# Patient Record
Sex: Female | Born: 1975 | Race: Black or African American | Hispanic: No | Marital: Single | Smoking: Never smoker
Health system: Southern US, Community
[De-identification: ages and names within clinical notes are randomized; demographics above are authoritative.]

## PROBLEM LIST (undated history)

## (undated) ENCOUNTER — Inpatient Hospital Stay (HOSPITAL_COMMUNITY): Payer: Self-pay

## (undated) DIAGNOSIS — R519 Headache, unspecified: Secondary | ICD-10-CM

## (undated) DIAGNOSIS — I10 Essential (primary) hypertension: Secondary | ICD-10-CM

## (undated) DIAGNOSIS — D649 Anemia, unspecified: Secondary | ICD-10-CM

## (undated) DIAGNOSIS — O26899 Other specified pregnancy related conditions, unspecified trimester: Secondary | ICD-10-CM

## (undated) DIAGNOSIS — R12 Heartburn: Secondary | ICD-10-CM

## (undated) DIAGNOSIS — Z5189 Encounter for other specified aftercare: Secondary | ICD-10-CM

## (undated) DIAGNOSIS — T7840XA Allergy, unspecified, initial encounter: Secondary | ICD-10-CM

## (undated) DIAGNOSIS — Z98891 History of uterine scar from previous surgery: Secondary | ICD-10-CM

## (undated) DIAGNOSIS — R51 Headache: Secondary | ICD-10-CM

## (undated) DIAGNOSIS — K219 Gastro-esophageal reflux disease without esophagitis: Secondary | ICD-10-CM

## (undated) HISTORY — PX: LAPAROSCOPY: SHX197

## (undated) HISTORY — DX: Essential (primary) hypertension: I10

## (undated) HISTORY — DX: Allergy, unspecified, initial encounter: T78.40XA

## (undated) HISTORY — DX: History of uterine scar from previous surgery: Z98.891

## (undated) HISTORY — DX: Gastro-esophageal reflux disease without esophagitis: K21.9

---

## 1998-01-16 ENCOUNTER — Emergency Department (HOSPITAL_COMMUNITY): Admission: EM | Admit: 1998-01-16 | Discharge: 1998-01-16 | Payer: Self-pay | Admitting: Emergency Medicine

## 1998-03-20 ENCOUNTER — Emergency Department (HOSPITAL_COMMUNITY): Admission: EM | Admit: 1998-03-20 | Discharge: 1998-03-20 | Payer: Self-pay

## 1998-10-09 ENCOUNTER — Emergency Department (HOSPITAL_COMMUNITY): Admission: EM | Admit: 1998-10-09 | Discharge: 1998-10-09 | Payer: Self-pay | Admitting: Emergency Medicine

## 1998-10-09 ENCOUNTER — Encounter: Payer: Self-pay | Admitting: Emergency Medicine

## 2000-03-09 ENCOUNTER — Other Ambulatory Visit: Admission: RE | Admit: 2000-03-09 | Discharge: 2000-03-09 | Payer: Self-pay | Admitting: Obstetrics and Gynecology

## 2000-03-31 ENCOUNTER — Encounter: Payer: Self-pay | Admitting: Obstetrics and Gynecology

## 2000-03-31 ENCOUNTER — Ambulatory Visit (HOSPITAL_COMMUNITY): Admission: RE | Admit: 2000-03-31 | Discharge: 2000-03-31 | Payer: Self-pay | Admitting: Obstetrics and Gynecology

## 2000-04-28 ENCOUNTER — Encounter: Payer: Self-pay | Admitting: Obstetrics and Gynecology

## 2000-04-28 ENCOUNTER — Ambulatory Visit (HOSPITAL_COMMUNITY): Admission: RE | Admit: 2000-04-28 | Discharge: 2000-04-28 | Payer: Self-pay | Admitting: Obstetrics and Gynecology

## 2000-05-17 DIAGNOSIS — D649 Anemia, unspecified: Secondary | ICD-10-CM

## 2000-05-17 HISTORY — DX: Anemia, unspecified: D64.9

## 2000-08-02 ENCOUNTER — Inpatient Hospital Stay (HOSPITAL_COMMUNITY): Admission: AD | Admit: 2000-08-02 | Discharge: 2000-08-02 | Payer: Self-pay | Admitting: Obstetrics and Gynecology

## 2000-08-15 ENCOUNTER — Inpatient Hospital Stay (HOSPITAL_COMMUNITY): Admission: AD | Admit: 2000-08-15 | Discharge: 2000-08-20 | Payer: Self-pay | Admitting: Obstetrics and Gynecology

## 2001-03-16 ENCOUNTER — Other Ambulatory Visit: Admission: RE | Admit: 2001-03-16 | Discharge: 2001-03-16 | Payer: Self-pay | Admitting: Obstetrics and Gynecology

## 2002-03-28 ENCOUNTER — Other Ambulatory Visit: Admission: RE | Admit: 2002-03-28 | Discharge: 2002-03-28 | Payer: Self-pay | Admitting: Obstetrics and Gynecology

## 2002-07-24 ENCOUNTER — Emergency Department (HOSPITAL_COMMUNITY): Admission: EM | Admit: 2002-07-24 | Discharge: 2002-07-24 | Payer: Self-pay | Admitting: Emergency Medicine

## 2004-01-10 ENCOUNTER — Other Ambulatory Visit: Admission: RE | Admit: 2004-01-10 | Discharge: 2004-01-10 | Payer: Self-pay | Admitting: Obstetrics and Gynecology

## 2005-01-11 ENCOUNTER — Other Ambulatory Visit: Admission: RE | Admit: 2005-01-11 | Discharge: 2005-01-11 | Payer: Self-pay | Admitting: Obstetrics and Gynecology

## 2005-06-24 ENCOUNTER — Emergency Department (HOSPITAL_COMMUNITY): Admission: EM | Admit: 2005-06-24 | Discharge: 2005-06-24 | Payer: Self-pay | Admitting: Emergency Medicine

## 2006-06-17 ENCOUNTER — Emergency Department (HOSPITAL_COMMUNITY): Admission: EM | Admit: 2006-06-17 | Discharge: 2006-06-17 | Payer: Self-pay | Admitting: Emergency Medicine

## 2010-08-16 LAB — HM PAP SMEAR

## 2011-11-04 ENCOUNTER — Encounter (HOSPITAL_COMMUNITY): Payer: Self-pay

## 2011-11-04 ENCOUNTER — Emergency Department (HOSPITAL_COMMUNITY)
Admission: EM | Admit: 2011-11-04 | Discharge: 2011-11-04 | Disposition: A | Discharge status: Home / Self Care | Payer: 59 | Source: Home / Self Care | Attending: Emergency Medicine | Admitting: Emergency Medicine

## 2011-11-04 DIAGNOSIS — J329 Chronic sinusitis, unspecified: Secondary | ICD-10-CM

## 2011-11-04 DIAGNOSIS — K2100 Gastro-esophageal reflux disease with esophagitis, without bleeding: Secondary | ICD-10-CM

## 2011-11-04 LAB — POCT RAPID STREP A: Streptococcus, Group A Screen (Direct): NEGATIVE

## 2011-11-04 MED ORDER — SUCRALFATE 1 GM/10ML PO SUSP: 1.0000 g | Freq: Four times a day (QID) | ORAL | Status: DC

## 2011-11-04 MED ORDER — FAMOTIDINE 20 MG PO TABS: 20.0000 mg | ORAL_TABLET | Freq: Two times a day (BID) | ORAL | Status: DC

## 2011-11-04 MED ORDER — FLUTICASONE PROPIONATE 50 MCG/ACT NA SUSP: 2.0000 | Freq: Every day | NASAL | Status: DC

## 2011-11-04 MED ORDER — DOXYCYCLINE HYCLATE 100 MG PO CAPS: 100.0000 mg | ORAL_CAPSULE | Freq: Two times a day (BID) | ORAL | Status: AC

## 2011-11-04 MED ORDER — PSEUDOEPHEDRINE-GUAIFENESIN ER 120-1200 MG PO TB12: 1.0000 | ORAL_TABLET | Freq: Two times a day (BID) | ORAL | Status: DC

## 2011-11-04 NOTE — ED Notes (Signed)
C/o cough and sore throat for 1 week.  States voice has been hoarse for 4 days and had 4 episodes of vomiting since last night.  Last vomited this am.

## 2011-11-04 NOTE — ED Provider Notes (Signed)
History     CSN: [id number]  Arrival date & time 11/04/11  1528   First MD Initiated Contact with Patient 11/04/11 1555      Chief Complaint  Patient presents with  . Sore Throat  . Cough    (Consider location/radiation/quality/duration/timing/severity/associated sxs/prior treatment) HPI Comments: Patient reports URI like symptoms with nasal congestion, rhinorrhea, postnasal drip, sore, irritated throat, and a nonproductive cough starting a week ago. She reports frontal sinus pressure/pain which is worse when she bends forward. No purulent nasal drainage. No fevers, dental pain, ear pain. She also reports hoarseness, a burning sensation in her chest, nausea, dyspepsia starting several days ago. Reports a subxiphoid burning, and States she vomited 4 times last night,  but thinks that this has been from the medications that she is taking. She is taking Robitussin and Tylenol. She states that the Robitussin has been helping with the sinus congestion. She is otherwise tolerating by mouth. No abdominal pain, decreased urine output, urinary complaints. No history of reflux.  ROS as noted in HPI. All other ROS negative.   Patient is a 36 y.o. female presenting with cough and pharyngitis. The history is provided by the patient. No language interpreter was used.  Cough  Sore Throat    History reviewed. No pertinent past medical history.  Past Surgical History  Procedure Date  . Cesarean section     History reviewed. No pertinent family history.  History  Substance Use Topics  . Smoking status: Never Smoker   . Smokeless tobacco: Not on file  . Alcohol Use: Yes    OB History    Grav Para Term Preterm Abortions TAB SAB Ect Mult Living                  Review of Systems  Respiratory: Positive for cough.     Allergies  Review of patient's allergies indicates no known allergies.  Home Medications   Current Outpatient Rx  Name Route Sig Dispense Refill  . ACETAMINOPHEN  500 MG PO TABS Oral Take 500 mg by mouth every 6 (six) hours as needed.    Marland Kitchen PSEUDOEPHEDRINE-DM-GG 30-10-100 MG/5ML PO LIQD Oral Take 10 mLs by mouth 4 (four) times daily as needed.    Marland Kitchen DOXYCYCLINE HYCLATE 100 MG PO CAPS Oral Take 1 capsule (100 mg total) by mouth 2 (two) times daily. X 7 days 14 capsule 0  . FAMOTIDINE 20 MG PO TABS Oral Take 1 tablet (20 mg total) by mouth 2 (two) times daily. 40 tablet 0  . FLUTICASONE PROPIONATE 50 MCG/ACT NA SUSP Nasal Place 2 sprays into the nose daily. 16 g 0  . PSEUDOEPHEDRINE-GUAIFENESIN ER [phone] MG PO TB12 Oral Take 1 tablet by mouth 2 (two) times daily. 20 each 0  . SUCRALFATE 1 GM/10ML PO SUSP Oral Take 10 mLs (1 g total) by mouth 4 (four) times daily. 10 mL before meals and before bedtime 240 mL 0    BP 125/84  Pulse 81  Temp 98.7 F (37.1 C) (Oral)  Resp 16  SpO2 98%  LMP 11/02/2011  Physical Exam  Nursing note and vitals reviewed. Constitutional: She is oriented to person, place, and time. She appears well-developed and well-nourished. No distress.  HENT:  Head: Normocephalic and atraumatic.  Right Ear: Tympanic membrane normal.  Left Ear: Tympanic membrane normal.  Nose: Mucosal edema and rhinorrhea present. Right sinus exhibits frontal sinus tenderness. Right sinus exhibits no maxillary sinus tenderness. Left sinus exhibits frontal sinus tenderness. Left sinus  exhibits no maxillary sinus tenderness.  Mouth/Throat: Uvula is midline and mucous membranes are normal. Posterior oropharyngeal erythema present. No oropharyngeal exudate or tonsillar abscesses.       Raspy voice. No drooling, stridor, muffled voice. Postnasal drip noted  Eyes: Conjunctivae and EOM are normal. Pupils are equal, round, and reactive to light.  Neck: Normal range of motion.  Cardiovascular: Normal rate, regular rhythm and normal heart sounds.   Pulmonary/Chest: Effort normal and breath sounds normal. She exhibits no tenderness.  Abdominal: Soft. Bowel sounds  are normal. She exhibits no distension. There is no tenderness. There is no rebound.  Musculoskeletal: Normal range of motion.  Neurological: She is alert and oriented to person, place, and time.  Skin: Skin is warm and dry.  Psychiatric: She has a normal mood and affect. Her behavior is normal. Judgment and thought content normal.    ED Course  Procedures (including critical care time)   Labs Reviewed  POCT RAPID STREP A ([facility] ONLY)   No results found.   1. Sinusitis   2. Reflux esophagitis     Results for orders placed during the hospital encounter of 11/04/11  POCT RAPID STREP A ([facility] ONLY)      Component Value Range   Streptococcus, Group A Screen (Direct) NEGATIVE  NEGATIVE    MDM  Patient has frontal sinus tenderness,  nasal congestion, H&P most consistent with a sinusitis. She also has some laryngitis, describing symptoms that are suggestive of reflux, especially as she has had increased coughing from all the postnasal drip. Lungs are clear. Reports sore throat, nausea, burning sensation in chest, which is worse with lying down, and eating. Will send home with saline nasal irrigation, Flonase, doxycycline for sinus infection. Also home with Carafate and some Pepcid for the reflux. Will refer her primary care practices for routine physical care.  Luiz Blare, MD 11/04/11 2045

## 2011-11-04 NOTE — Discharge Instructions (Signed)
[facility]: [address] [place] [address] [phone]  [facility]: [address] [place] [address]   [phone]  [facility]: [address] [place] [address] [phone]  [facility] : [address] [place] [address] [phone]  [facility]: [address] [place] [address] [phone]  [facility]: [address] [place] [address] [phone]  Dr. Oneal Grout [address] [facility] [place] [address] [phone]  Take the medication as written. Return if you get worse, have a fever >100.4, or for any concerns. You may take 600 mg of motrin with 1 gram of tylenol up to 4 times a day as needed for pain. This is an effective combination for pain.  Most sinus infections are viral and do not need antibiotics unless you have a high fever, have had this for 10 day, or you get better and then get sick again. Use a neti pot or the NeilMed sinus rinse as often as you want to to reduce nasal congestion. Follow the directions on the box.   Go to www.goodrx.com to look up your medications. This will give you a list of where you can find your prescriptions at the most affordable prices.

## 2011-11-12 ENCOUNTER — Telehealth (HOSPITAL_COMMUNITY): Payer: Self-pay | Admitting: *Deleted

## 2011-11-12 NOTE — ED Notes (Signed)
FMLA papers signed by Dr. Chaney Malling on 6/26. I faxed them and pt.'s chart after 1700 to [phone].  6/27  I did not get a confirmation, so I was not sure if they went through.  6 /28 I called Wyman Songster and left a message x 2. I have not heard from her. I re-faxed form and got a confirmation. I called pt.'s phone but it was not in service. I called her contact and left a message for her to call me. Vassie Moselle 11/12/2011

## 2011-11-12 NOTE — ED Notes (Signed)
Pt. called back and I told her she could pick up her original forms at the front desk in a brown envelope.  Pt. said she would come tomorrow. Vanessa Shannon 11/12/2011

## 2013-04-03 ENCOUNTER — Ambulatory Visit (INDEPENDENT_AMBULATORY_CARE_PROVIDER_SITE_OTHER): Payer: 59 | Admitting: Family Medicine

## 2013-04-03 ENCOUNTER — Encounter: Payer: Self-pay | Admitting: Family Medicine

## 2013-04-03 VITALS — BP 130/80 | Temp 98.8°F | Ht 64.25 in | Wt 185.0 lb

## 2013-04-03 DIAGNOSIS — Z7689 Persons encountering health services in other specified circumstances: Secondary | ICD-10-CM

## 2013-04-03 DIAGNOSIS — Z7189 Other specified counseling: Secondary | ICD-10-CM

## 2013-04-03 DIAGNOSIS — E669 Obesity, unspecified: Secondary | ICD-10-CM

## 2013-04-03 NOTE — Patient Instructions (Addendum)
-  We have ordered labs or studies at this visit. It can take up to 1-2 weeks for results and processing. We will contact you with instructions IF your results are abnormal. Normal results will be released to your MYCHART. If you have not heard from us or can not find your results in MYCHART in 2 weeks please contact our office.  -PLEASE SIGN UP FOR MYCHART TODAY   We recommend the following healthy lifestyle measures: - eat a healthy diet consisting of lots of vegetables, fruits, beans, nuts, seeds, healthy meats such as white chicken and fish and whole grains.  - avoid fried foods, fast food, processed foods, sodas, red meet and other fattening foods.  - get a least 150 minutes of aerobic exercise per week.   Follow up in: 1 year or as needed  

## 2013-04-03 NOTE — Progress Notes (Signed)
Chief Complaint  Patient presents with  . Establish Care    HPI:  Vanessa Shannon is here to establish care. Has gyn (Dr. Modesto Charon) - but has not has family doctor.  Last PCP and physical: last physical 2 years ago  Has the following chronic problems and concerns today:  There are no active problems to display for this patient.  Health Maintenance: UTD on flu, tdap  ROS: See pertinent positives and negatives per HPI.  Past Medical History  Diagnosis Date  . GERD (gastroesophageal reflux disease)     Family History  Problem Relation Age of Onset  . Lupus Mother   . Hypertension Mother   . Cancer Father     brain cancer  . Stroke Maternal Grandmother   . Hypertension Maternal Grandmother   . Stroke Paternal Grandfather     History   Social History  . Marital Status: Single    Spouse Name: N/A    Number of Children: N/A  . Years of Education: N/A   Social History Main Topics  . Smoking status: Never Smoker   . Smokeless tobacco: None  . Alcohol Use: Yes     Comment: occ/social   . Drug Use: No  . Sexual Activity: None   Other Topics Concern  . None   Social History Narrative   Work or School: Psychologist, sport and exercise at [employer]; going back to school for nursing      Home Situation: lives with daughter (32 yr old in 2014) and grandmother      Spiritual Beliefs: Christian      Lifestyle: no regular exercise; diet is horrible             Current outpatient prescriptions:Biotin 2.5 MG CAPS, Take by mouth., Disp: , Rfl:   EXAM:  Filed Vitals:   04/03/13 1612  BP: 130/80  Temp: 98.8 F (37.1 C)    Body mass index is 31.51 kg/(m^2).  GENERAL: vitals reviewed and listed above, alert, oriented, appears well hydrated and in no acute distress  HEENT: atraumatic, conjunttiva clear, no obvious abnormalities on inspection of external nose and ears  NECK: no obvious masses on inspection  LUNGS: clear to auscultation bilaterally, no wheezes, rales or rhonchi,  good air movement  CV: HRRR, no peripheral edema  MS: moves all extremities without noticeable abnormality  PSYCH: pleasant and cooperative, no obvious depression or anxiety  ASSESSMENT AND PLAN:  Discussed the following assessment and plan:  Encounter to establish care  Obesity, unspecified - Plan: Hemoglobin A1C, Lipid Panel  -We reviewed the PMH, PSH, FH, SH, Meds and Allergies. -We provided refills for any medications we will prescribe as needed. -We addressed current concerns per orders and patient instructions. -We have asked for records for pertinent exams, studies, vaccines and notes from previous providers. -We have advised patient to follow up per instructions below.   -Patient advised to return or notify a doctor immediately if symptoms worsen or persist or new concerns arise.  Patient Instructions  ..../.,,,,,/.,-We have ordered labs or studies at this visit. It can take up to 1-2 weeks for results and processing. We will contact you with instructions IF your results are abnormal. Normal results will be released to your [facility]. If you have not heard from [place] or can not find your results in [facility] in 2 weeks please contact our office.  -PLEASE SIGN UP FOR MYCHART TODAY   We recommend the following healthy lifestyle measures: - eat a healthy diet consisting of  lots of vegetables, fruits, beans, nuts, seeds, healthy meats such as white chicken and fish and whole grains.  - avoid fried foods, fast food, processed foods, sodas, red meet and other fattening foods.  - get a least 150 minutes of aerobic exercise per week.   Follow up in: 1 year or as needed      [name], [name]

## 2013-04-04 LAB — HEMOGLOBIN A1C: Hgb A1c MFr Bld: 4.5 % — ABNORMAL LOW (ref 4.6–6.5)

## 2013-04-04 LAB — LIPID PANEL
Total CHOL/HDL Ratio: 3
Triglycerides: 79 mg/dL (ref 0.0–149.0)

## 2013-04-04 NOTE — Progress Notes (Signed)
Quick Note:  Left a message for return call. ______ 

## 2013-04-13 NOTE — Progress Notes (Signed)
Quick Note:  Left a message for pt to return call; labs mailed to pt's home address. ______

## 2013-08-06 ENCOUNTER — Telehealth: Payer: Self-pay | Admitting: Family Medicine

## 2013-08-06 NOTE — Telephone Encounter (Signed)
Patient Information:  Caller Name: Sherrian  Phone: [phone]  Patient: Vanessa Shannon, Vanessa Shannon  Gender: Female  DOB: Sep 15, 1975  Age: 38 Years  PCP: Selena BattenKim (TEXT 1st, after 20 mins can call), Dahlia ClientH[facility])  Pregnant: No  Office Follow Up:  Does the office need to follow up with this patient?: No  Instructions For The Office: N/A  RN Note:  Patient calling c/o sinus congestion & headache.  Requesting appointment for 08/09/13.  Symptoms  Reason For Call & Symptoms: headache  Reviewed Health History In EMR: Yes  Reviewed Medications In EMR: Yes  Reviewed Allergies In EMR: Yes  Reviewed Surgeries / Procedures: Yes  Date of Onset of Symptoms: 07/16/2013 OB / GYN:  LMP: 07/23/2013  Guideline(s) Used:  Headache  Sinus Pain and Congestion  Disposition Per Guideline:   See Today or Tomorrow in Office  Reason For Disposition Reached:   Sinus congestion (pressure, fullness) present > 10 days  Advice Given:  Call Back If:   You become worse.  Patient Will Follow Care Advice:  YES

## 2013-08-08 NOTE — Telephone Encounter (Signed)
Noted appt with Orvan Falconerampbell.

## 2013-08-09 ENCOUNTER — Ambulatory Visit (INDEPENDENT_AMBULATORY_CARE_PROVIDER_SITE_OTHER): Payer: 59 | Admitting: Family

## 2013-08-09 ENCOUNTER — Encounter: Payer: Self-pay | Admitting: Family

## 2013-08-09 VITALS — BP 108/80 | HR 88 | Temp 98.2°F | Wt 182.0 lb

## 2013-08-09 DIAGNOSIS — J309 Allergic rhinitis, unspecified: Secondary | ICD-10-CM

## 2013-08-09 DIAGNOSIS — R51 Headache: Secondary | ICD-10-CM

## 2013-08-09 MED ORDER — PREDNISONE 20 MG PO TABS: ORAL_TABLET | ORAL | Status: AC

## 2013-08-09 NOTE — Progress Notes (Signed)
Subjective:    Patient ID: Vanessa Shannon, female    DOB: 1975/07/21, 38 y.o.   MRN: [id number]  HPI 38 year old African American female, nonsmoker who presents today with complaints of frontal sinus headache on the right side x4 weeks. Reports when she inhales a deep breath she has a sharp pain in her right frontal area. She's been taking over-the-counter Sudafed to help some. Reports cough and congestion but denies any sneezing. Has a history of seasonal allergies. Also has a family history of a brain tumor in her father deceased at age 39. Denies blurred vision or double vision.   Review of Systems  Constitutional: Negative.  Negative for fatigue.  HENT: Positive for congestion, sinus pressure and sore throat.   Eyes: Positive for itching.  Respiratory: Positive for cough. Negative for wheezing.   Cardiovascular: Negative.   Gastrointestinal: Negative.   Musculoskeletal: Negative.   Skin: Negative.   Allergic/Immunologic: Positive for environmental allergies.  Psychiatric/Behavioral: Negative.    Past Medical History  Diagnosis Date  . GERD (gastroesophageal reflux disease)     History   Social History  . Marital Status: Single    Spouse Name: N/A    Number of Children: N/A  . Years of Education: N/A   Occupational History  . Not on file.   Social History Main Topics  . Smoking status: Never Smoker   . Smokeless tobacco: Not on file  . Alcohol Use: Yes     Comment: occ/social   . Drug Use: No  . Sexual Activity: Not on file   Other Topics Concern  . 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 Cesarean section      Family History  Problem Relation Age  of Onset  . Lupus Mother   . Hypertension Mother   . Cancer Father     brain cancer  . Stroke Maternal Grandmother   . Hypertension Maternal Grandmother   . Stroke Paternal Grandfather     No Known Allergies  Current Outpatient Prescriptions on File Prior to Visit  Medication Sig Dispense Refill  . Biotin 2.5 MG CAPS Take by mouth.       No current facility-administered medications on file prior to visit.    BP 108/80  Pulse 88  Temp(Src) 98.2 F (36.8 C) (Oral)  Wt 182 lb (82.555 kg)  SpO2 99%chart    Objective:   Physical Exam  Constitutional: She is oriented to person, place, and time. She appears well-developed and well-nourished.  HENT:  Right Ear: External ear normal.  Left Ear: External ear normal.  Nose: Nose normal.  Mouth/Throat: Oropharynx is clear and moist.  Right frontal sinus tenderness to palpation.  Neck: Normal range of motion. Neck supple.  Cardiovascular: Normal rate, regular rhythm and normal heart sounds.   Pulmonary/Chest: Effort normal and breath sounds normal.  Musculoskeletal: Normal range of motion.  Neurological: She is alert and oriented to person, place, and time.  Skin: Skin is warm and dry.  Psychiatric: She has a normal mood and affect.  Assessment & Plan:  Oree was seen today for headache.  Diagnoses and associated orders for this visit:  Allergic rhinitis  Headache(784.0)  Other Orders - predniSONE (DELTASONE) 20 MG tablet; 60mg  PO qam x 3 days, 40mg  po qam x 3 days, 20mg  qam x 3 days   Call the office with any questions or concerns. Check as scheduled and as needed.

## 2013-08-09 NOTE — Patient Instructions (Signed)
If no better, call and we will order a CT of the head.    Hay Fever Hay fever is an allergic reaction to particles in the air. It cannot be passed from person to person. It cannot be cured, but it can be controlled. CAUSES  Hay fever is caused by something that triggers an allergic reaction (allergens). The following are examples of allergens:  Ragweed.  Feathers.  Animal dander.  Grass and tree pollens.  Cigarette smoke.  House dust.  Pollution. SYMPTOMS   Sneezing.  Runny or stuffy nose.  Tearing eyes.  Itchy eyes, nose, mouth, throat, skin, or other area.  Sore throat.  Headache.  Decreased sense of smell or taste. DIAGNOSIS Your caregiver will perform a physical exam and ask questions about the symptoms you are having.Allergy testing may be done to determine exactly what triggers your hay fever.  TREATMENT   Over-the-counter medicines may help symptoms. These include:  Antihistamines.  Decongestants. These may help with nasal congestion.  Your caregiver may prescribe medicines if over-the-counter medicines do not work.  Some people benefit from allergy shots when other medicines are not helpful. HOME CARE INSTRUCTIONS   Avoid the allergen that is causing your symptoms, if possible.  Take all medicine as told by your caregiver. SEEK MEDICAL CARE IF:   You have severe allergy symptoms and your current medicines are not helping.  Your treatment was working at one time, but you are now experiencing symptoms.  You have sinus congestion and pressure.  You develop a fever or headache.  You have thick nasal discharge.  You have asthma and have a worsening cough and wheezing. SEEK IMMEDIATE MEDICAL CARE IF:   You have swelling of your tongue or lips.  You have trouble breathing.  You feel lightheaded or like you are going to faint.  You have cold sweats.  You have a fever. Document Released: 05/03/2005 Document Revised: 07/26/2011 Document  Reviewed: 07/29/2010 [facility] Patient Information 2014 [place].

## 2013-10-01 ENCOUNTER — Telehealth: Payer: Self-pay | Admitting: Family Medicine

## 2013-10-01 NOTE — Telephone Encounter (Signed)
Pt was seen on 08/09/13 by padonda, states she had written her an rx predisone, and advised her that if it did not work she needed to have a mini ct scan, however pt states the medicine help and wants to get a new rx again, feels it is just sinuses. [facility] [address]

## 2013-10-01 NOTE — Telephone Encounter (Signed)
I called the pt and scheduled an appt with Dr Selena BattenKim for 10/03/13 at 8:45am.

## 2013-10-01 NOTE — Telephone Encounter (Signed)
Needs appt to examine.

## 2013-10-01 NOTE — Telephone Encounter (Signed)
Error/gd °

## 2013-10-03 ENCOUNTER — Encounter: Payer: Self-pay | Admitting: Family Medicine

## 2013-10-03 ENCOUNTER — Ambulatory Visit (INDEPENDENT_AMBULATORY_CARE_PROVIDER_SITE_OTHER): Payer: 59 | Admitting: Family Medicine

## 2013-10-03 VITALS — BP 110/82 | HR 78 | Temp 98.6°F | Ht 64.25 in | Wt 183.0 lb

## 2013-10-03 DIAGNOSIS — G43909 Migraine, unspecified, not intractable, without status migrainosus: Secondary | ICD-10-CM

## 2013-10-03 DIAGNOSIS — J309 Allergic rhinitis, unspecified: Secondary | ICD-10-CM

## 2013-10-03 DIAGNOSIS — J329 Chronic sinusitis, unspecified: Secondary | ICD-10-CM

## 2013-10-03 MED ORDER — AMOXICILLIN-POT CLAVULANATE 875-125 MG PO TABS: 1.0000 | ORAL_TABLET | Freq: Two times a day (BID) | ORAL | Status: DC

## 2013-10-03 MED ORDER — FLUTICASONE PROPIONATE 50 MCG/ACT NA SUSP: 2.0000 | Freq: Every day | NASAL | Status: DC

## 2013-10-03 MED ORDER — SUMATRIPTAN SUCCINATE 50 MG PO TABS: ORAL_TABLET | ORAL | Status: DC

## 2013-10-03 NOTE — Progress Notes (Signed)
Pre visit review using our clinic review tool, if applicable. No additional management support is needed unless otherwise documented below in the visit note. 

## 2013-10-03 NOTE — Progress Notes (Signed)
No chief complaint on file.   HPI:  Headaches: -seen by Vanessa Shannon 3/26 and treated with prednisone for ? Allergic sinusitis -reports these sharp stabbing headaches completely resolved -hx of migraine headaches from childhood and flare of this recently - light sensitivity at times -symptoms: sinus pain started about 1 week ago - R sided sometime, sometimes L sided, usually around eye, popping in nostrils, nasal congestion, sneezing, PND, cough, allergy issues, R maxillary and tooth pain, light sensitivity at times when has migraine -takes ibuprofen every day which helps but if stops the ibuprofen gets a headaches  -denies: fevers, chills, vision changes, weakness, numbness, weight loss -hx of sinus infection; felt like this in the past  ROS: See pertinent positives and negatives per HPI.  Past Medical History  Diagnosis Date  . GERD (gastroesophageal reflux disease)     Past Surgical History  Procedure Laterality Date  . Cesarean section      Family History  Problem Relation Age of Onset  . Lupus Mother   . Hypertension Mother   . Cancer Father     brain cancer  . Stroke Maternal Grandmother   . Hypertension Maternal Grandmother   . Stroke Paternal Grandfather     History   Social History  . Marital Status: Single    Spouse Name: N/A    Number of Children: N/A  . Years of Education: N/A   Social History Main Topics  . Smoking status: Never Smoker   . Smokeless tobacco: None  . Alcohol Use: Yes     Comment: occ/social   . Drug Use: No  . Sexual Activity: None   Other Topics Concern  . 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 , Disp: , Rfl: ;  pseudoephedrine (SUDAFED) 30 MG tablet, Take 30 mg by mouth as needed for congestion., Disp: , Rfl: ;  amoxicillin-clavulanate (AUGMENTIN) 875-125 MG per tablet, Take 1 tablet by mouth 2 (two) times daily., Disp: 20 tablet, Rfl: 0 fluticasone (FLONASE) 50 MCG/ACT nasal spray, Place 2 sprays into both nostrils daily., Disp: 16 g, Rfl: 2;  SUMAtriptan (IMITREX) 50 MG tablet, Take 1 at the onset of migraine then can take one more in 2 hours if needed. No more then 2 in 24 hours., Disp: 10 tablet, Rfl: 0  EXAM:  Filed Vitals:   10/03/13 0900  BP: 110/82  Pulse: 78  Temp: 98.6 F (37 C)    Body mass index is 31.17 kg/(m^2).  GENERAL: vitals reviewed and listed above, alert, oriented, appears well hydrated and in no acute distress  HEENT: atraumatic, conjunttiva clear, PERRLA, EOMI, visual acuity grossly intact, no obvious abnormalities on inspection of external nose and ears, normal appearance of ear canals and TMs, clear nasal congestion, mild post oropharyngeal erythema with PND, no tonsillar edema or exudate, no sinus TTP  NECK: no obvious masses on inspection  LUNGS: clear to auscultation bilaterally, no wheezes, rales or rhonchi, good air movement  CV: HRRR, no  peripheral edema  MS: moves all extremities without noticeable abnormality  PSYCH: pleasant and cooperative, no obvious depression or anxiety  NEURO: CN II-XII grossly intact, finger to nose normal  ASSESSMENT AND PLAN:  Discussed the following assessment and plan:  Allergic rhinitis - Plan: fluticasone (FLONASE) 50 MCG/ACT nasal spray  Sinusitis - Plan: amoxicillin-clavulanate (AUGMENTIN) 875-125 MG per tablet  Migraine - Plan: SUMAtriptan (IMITREX) 50 MG tablet  -we discussed possible serious and likely etiologies, workup and treatment, treatment risks and return precautions - likely AR, secondary sinuitis,  migraine -after this discussion, Vanessa Shannon opted for trial of triptan, monitor triggers, tx of allergies and sinusitis -follow up advised in one month -of course, we advised Vanessa Shannon  to return or notify a doctor immediately if symptoms worsen or persist or new concerns arise.  -Patient advised to return or notify a doctor immediately if symptoms worsen or persist or new concerns arise.  Patient Instructions  -use the over the counter pain medication no more then 2-3 times per week  -complete the antibiotic -As we discussed, we have prescribed a new medication (AUGMENTIN) for you at this appointment. We discussed the common and serious potential adverse effects of this medication and you can review these and more with the pharmacist when you pick up your medication.  Please follow the instructions for use carefully and notify us immediately if you have any problems taking this medication.  -try the migraine medication  -take zyrtec before your go to sleep daily  -take the flonase 2 sprays each nostril daily  -keep headache journal  Follow up in 1 month     Vanessa Vanessa Shannon

## 2013-10-03 NOTE — Patient Instructions (Addendum)
-  use the over the counter pain medication no more then 2-3 times per week  -complete the antibiotic -As we discussed, we have prescribed a new medication (AUGMENTIN) for you at this appointment. We discussed the common and serious potential adverse effects of this medication and you can review these and more with the pharmacist when you pick up your medication.  Please follow the instructions for use carefully and notify us immediately if you have any problems taking this medication.  -try the migraine medication  -take zyrtec before your go to sleep daily  -take the flonase 2 sprays each nostril daily  -keep headache journal  Follow up in 1 month

## 2014-08-28 LAB — OB RESULTS CONSOLE RPR: RPR: NONREACTIVE

## 2014-08-28 LAB — OB RESULTS CONSOLE HIV ANTIBODY (ROUTINE TESTING): HIV: NONREACTIVE

## 2014-08-28 LAB — OB RESULTS CONSOLE GC/CHLAMYDIA
Chlamydia: NEGATIVE
Gonorrhea: NEGATIVE

## 2014-08-28 LAB — OB RESULTS CONSOLE ABO/RH: RH TYPE: POSITIVE

## 2014-08-28 LAB — OB RESULTS CONSOLE GBS: STREP GROUP B AG: NEGATIVE

## 2014-08-28 LAB — OB RESULTS CONSOLE RUBELLA ANTIBODY, IGM: RUBELLA: IMMUNE

## 2014-08-28 LAB — OB RESULTS CONSOLE ANTIBODY SCREEN: ANTIBODY SCREEN: NEGATIVE

## 2014-11-14 ENCOUNTER — Encounter (HOSPITAL_COMMUNITY): Payer: Self-pay

## 2014-11-15 ENCOUNTER — Encounter (HOSPITAL_COMMUNITY)
Admission: RE | Admit: 2014-11-15 | Discharge: 2014-11-15 | Disposition: A | Discharge status: Home / Self Care | Payer: 59 | Source: Ambulatory Visit | Attending: Obstetrics and Gynecology | Admitting: Obstetrics and Gynecology

## 2014-11-15 ENCOUNTER — Encounter (HOSPITAL_COMMUNITY): Payer: Self-pay

## 2014-11-15 DIAGNOSIS — Z01818 Encounter for other preprocedural examination: Secondary | ICD-10-CM | POA: Insufficient documentation

## 2014-11-15 HISTORY — DX: Headache, unspecified: R51.9

## 2014-11-15 HISTORY — DX: Other specified pregnancy related conditions, unspecified trimester: O26.899

## 2014-11-15 HISTORY — DX: Anemia, unspecified: D64.9

## 2014-11-15 HISTORY — DX: Headache: R51

## 2014-11-15 HISTORY — DX: Other specified pregnancy related conditions, unspecified trimester: R12

## 2014-11-15 LAB — CBC
HEMATOCRIT: 35.5 % — AB (ref 36.0–46.0)
HEMOGLOBIN: 11.9 g/dL — AB (ref 12.0–15.0)
MCH: 32.5 pg (ref 26.0–34.0)
MCHC: 33.5 g/dL (ref 30.0–36.0)
MCV: 97 fL (ref 78.0–100.0)
PLATELETS: 163 10*3/uL (ref 150–400)
RBC: 3.66 MIL/uL — ABNORMAL LOW (ref 3.87–5.11)
RDW: 13.2 % (ref 11.5–15.5)
WBC: 7.5 10*3/uL (ref 4.0–10.5)

## 2014-11-15 LAB — ABO/RH: ABO/RH(D): O POS

## 2014-11-15 LAB — TYPE AND SCREEN
ABO/RH(D): O POS
Antibody Screen: NEGATIVE

## 2014-11-15 NOTE — Patient Instructions (Signed)
Your procedure is scheduled on:7/5.16  Enter through the Main Entrance at :11am Pick up desk phone and dial [phone] and inform us of your arrival.  Please call [phone] if you have any problems the morning of surgery.  Remember: Do not eat food or drink liquids, including water, after midnight:Monday Clear liquids are ok until:8am on Tuesday   You may brush your teeth the morning of surgery.   DO NOT wear jewelry, eye make-up, lipstick,body lotion, or dark fingernail polish.  (Polished toes are ok) You may wear deodorant.  If you are to be admitted after surgery, leave suitcase in car until your room has been assigned. Patients discharged on the day of surgery will not be allowed to drive home. Wear loose fitting, comfortable clothes for your ride home.

## 2014-11-16 LAB — RPR: RPR Ser Ql: NONREACTIVE

## 2014-11-17 ENCOUNTER — Encounter (HOSPITAL_COMMUNITY): Payer: Self-pay | Admitting: Anesthesiology

## 2014-11-17 NOTE — Anesthesia Preprocedure Evaluation (Addendum)
Anesthesia Evaluation  Patient identified by MRN, date of birth, ID band Patient awake    Reviewed: Allergy & Precautions, NPO status , Patient's Chart, lab work & pertinent test results  Airway Mallampati: III  TM Distance: >3 FB Neck ROM: Full    Dental no notable dental hx. (+) Teeth Intact   Pulmonary neg pulmonary ROS,  breath sounds clear to auscultation  Pulmonary exam normal       Cardiovascular negative cardio ROS Normal cardiovascular examRhythm:Regular Rate:Normal     Neuro/Psych  Headaches, negative psych ROS   GI/Hepatic Neg liver ROS, GERD-  Medicated and Controlled,  Endo/Other  Obesity  Renal/GU negative Renal ROS  negative genitourinary   Musculoskeletal negative musculoskeletal ROS (+)   Abdominal (+) + obese,   Peds  Hematology  (+) anemia ,   Anesthesia Other Findings   Reproductive/Obstetrics (+) Pregnancy Previous C/Section Post op hemorrhage                            Anesthesia Physical Anesthesia Plan  ASA: II  Anesthesia Plan: Spinal   Post-op Pain Management:    Induction:   Airway Management Planned: Natural Airway  Additional Equipment:   Intra-op Plan:   Post-operative Plan:   Informed Consent: I have reviewed the patients History and Physical, chart, labs and discussed the procedure including the risks, benefits and alternatives for the proposed anesthesia with the patient or authorized representative who has indicated his/her understanding and acceptance.     Plan Discussed with: Anesthesiologist, CRNA and Surgeon  Anesthesia Plan Comments:         Anesthesia Quick Evaluation

## 2014-11-18 MED ORDER — CEFAZOLIN SODIUM-DEXTROSE 2-3 GM-% IV SOLR: 2.0000 g | INTRAVENOUS | Status: DC

## 2014-11-19 ENCOUNTER — Encounter (HOSPITAL_COMMUNITY): Payer: Self-pay

## 2014-11-19 ENCOUNTER — Inpatient Hospital Stay (HOSPITAL_COMMUNITY)
Admission: RE | Admit: 2014-11-19 | Discharge: 2014-11-21 | DRG: 766 | Disposition: A | Discharge status: Home / Self Care | Payer: 59 | Source: Ambulatory Visit | Attending: Obstetrics and Gynecology | Admitting: Obstetrics and Gynecology

## 2014-11-19 ENCOUNTER — Inpatient Hospital Stay (HOSPITAL_COMMUNITY): Payer: 59 | Admitting: Anesthesiology

## 2014-11-19 ENCOUNTER — Encounter (HOSPITAL_COMMUNITY)
Admission: RE | Disposition: A | Discharge status: Home / Self Care | Payer: Self-pay | Source: Ambulatory Visit | Attending: Obstetrics and Gynecology

## 2014-11-19 DIAGNOSIS — O09523 Supervision of elderly multigravida, third trimester: Secondary | ICD-10-CM | POA: Diagnosis not present

## 2014-11-19 DIAGNOSIS — O3421 Maternal care for scar from previous cesarean delivery: Principal | ICD-10-CM | POA: Diagnosis present

## 2014-11-19 DIAGNOSIS — K219 Gastro-esophageal reflux disease without esophagitis: Secondary | ICD-10-CM | POA: Diagnosis present

## 2014-11-19 DIAGNOSIS — O9962 Diseases of the digestive system complicating childbirth: Secondary | ICD-10-CM | POA: Diagnosis present

## 2014-11-19 DIAGNOSIS — Z3483 Encounter for supervision of other normal pregnancy, third trimester: Secondary | ICD-10-CM | POA: Diagnosis present

## 2014-11-19 DIAGNOSIS — Z3A39 39 weeks gestation of pregnancy: Secondary | ICD-10-CM | POA: Diagnosis present

## 2014-11-19 DIAGNOSIS — Z98891 History of uterine scar from previous surgery: Secondary | ICD-10-CM

## 2014-11-19 LAB — PREPARE RBC (CROSSMATCH)

## 2014-11-19 SURGERY — Surgical Case
Anesthesia: Spinal | Site: Abdomen

## 2014-11-19 MED ORDER — LACTATED RINGERS IV SOLN
INTRAVENOUS | Status: DC | PRN
Start: 2014-11-19 — End: 2014-11-19
  Administered 2014-11-19: 13:00:00 via INTRAVENOUS

## 2014-11-19 MED ORDER — NALOXONE HCL 1 MG/ML IJ SOLN: 1.0000 ug/kg/h | INTRAVENOUS | Status: DC | PRN

## 2014-11-19 MED ORDER — MEPERIDINE HCL 25 MG/ML IJ SOLN: 6.2500 mg | INTRAMUSCULAR | Status: DC | PRN

## 2014-11-19 MED ORDER — FENTANYL CITRATE (PF) 100 MCG/2ML IJ SOLN
25.0000 ug | INTRAMUSCULAR | Status: DC | PRN
  Administered 2014-11-19: 50 ug via INTRAVENOUS

## 2014-11-19 MED ORDER — KETOROLAC TROMETHAMINE 30 MG/ML IJ SOLN
30.0000 mg | Freq: Four times a day (QID) | INTRAMUSCULAR | Status: AC | PRN
  Administered 2014-11-19: 30 mg via INTRAMUSCULAR

## 2014-11-19 MED ORDER — PHENYLEPHRINE 8 MG IN D5W 100 ML (0.08MG/ML) PREMIX OPTIME
INJECTION | INTRAVENOUS | Status: DC | PRN
  Administered 2014-11-19: 60 ug/min via INTRAVENOUS

## 2014-11-19 MED ORDER — PRENATAL MULTIVITAMIN CH
1.0000 | ORAL_TABLET | Freq: Every day | ORAL | Status: DC
  Administered 2014-11-20 – 2014-11-21 (×2): 1 via ORAL
  Filled 2014-11-19 (×2): qty 1

## 2014-11-19 MED ORDER — MENTHOL 3 MG MT LOZG: 1.0000 | LOZENGE | OROMUCOSAL | Status: DC | PRN

## 2014-11-19 MED ORDER — DIPHENHYDRAMINE HCL 50 MG/ML IJ SOLN: 12.5000 mg | INTRAMUSCULAR | Status: DC | PRN

## 2014-11-19 MED ORDER — SODIUM CHLORIDE 0.9 % IJ SOLN: 3.0000 mL | INTRAMUSCULAR | Status: DC | PRN

## 2014-11-19 MED ORDER — BUPIVACAINE IN DEXTROSE 0.75-8.25 % IT SOLN
INTRATHECAL | Status: DC | PRN
  Administered 2014-11-19: 1.5 mL via INTRATHECAL

## 2014-11-19 MED ORDER — SENNOSIDES-DOCUSATE SODIUM 8.6-50 MG PO TABS
2.0000 | ORAL_TABLET | ORAL | Status: DC
  Administered 2014-11-20 (×2): 2 via ORAL
  Filled 2014-11-19 (×2): qty 2

## 2014-11-19 MED ORDER — NALBUPHINE HCL 10 MG/ML IJ SOLN: 5.0000 mg | INTRAMUSCULAR | Status: DC | PRN

## 2014-11-19 MED ORDER — MORPHINE SULFATE (PF) 0.5 MG/ML IJ SOLN
INTRAMUSCULAR | Status: DC | PRN
  Administered 2014-11-19: .2 mg via INTRATHECAL

## 2014-11-19 MED ORDER — FENTANYL CITRATE (PF) 100 MCG/2ML IJ SOLN
INTRAMUSCULAR | Status: DC | PRN
  Administered 2014-11-19: 10 ug via INTRATHECAL

## 2014-11-19 MED ORDER — ZOLPIDEM TARTRATE 5 MG PO TABS: 5.0000 mg | ORAL_TABLET | Freq: Every evening | ORAL | Status: DC | PRN

## 2014-11-19 MED ORDER — OXYCODONE-ACETAMINOPHEN 5-325 MG PO TABS
1.0000 | ORAL_TABLET | ORAL | Status: DC | PRN
  Administered 2014-11-20: 1 via ORAL
  Administered 2014-11-20: 2 via ORAL
  Administered 2014-11-20 – 2014-11-21 (×3): 1 via ORAL
  Administered 2014-11-21: 2 via ORAL
  Filled 2014-11-19 (×5): qty 1
  Filled 2014-11-19: qty 2
  Filled 2014-11-19: qty 1

## 2014-11-19 MED ORDER — WITCH HAZEL-GLYCERIN EX PADS: 1.0000 "application " | MEDICATED_PAD | CUTANEOUS | Status: DC | PRN

## 2014-11-19 MED ORDER — ONDANSETRON HCL 4 MG/2ML IJ SOLN
4.0000 mg | Freq: Three times a day (TID) | INTRAMUSCULAR | Status: DC | PRN
  Administered 2014-11-19: 4 mg via INTRAVENOUS
  Filled 2014-11-19: qty 2

## 2014-11-19 MED ORDER — MEASLES, MUMPS & RUBELLA VAC ~~LOC~~ INJ
0.5000 mL | INJECTION | Freq: Once | SUBCUTANEOUS | Status: DC
  Filled 2014-11-19: qty 0.5

## 2014-11-19 MED ORDER — ONDANSETRON HCL 4 MG/2ML IJ SOLN
INTRAMUSCULAR | Status: DC | PRN
  Administered 2014-11-19: 4 mg via INTRAVENOUS

## 2014-11-19 MED ORDER — OXYTOCIN 10 UNIT/ML IJ SOLN
40.0000 [IU] | INTRAVENOUS | Status: DC | PRN
  Administered 2014-11-19: 40 [IU] via INTRAVENOUS

## 2014-11-19 MED ORDER — OXYTOCIN 40 UNITS IN LACTATED RINGERS INFUSION - SIMPLE MED: 62.5000 mL/h | INTRAVENOUS | Status: AC

## 2014-11-19 MED ORDER — MAGNESIUM HYDROXIDE 400 MG/5ML PO SUSP: 30.0000 mL | ORAL | Status: DC | PRN

## 2014-11-19 MED ORDER — IBUPROFEN 600 MG PO TABS: 600.0000 mg | ORAL_TABLET | Freq: Four times a day (QID) | ORAL | Status: DC | PRN

## 2014-11-19 MED ORDER — FENTANYL CITRATE (PF) 100 MCG/2ML IJ SOLN
INTRAMUSCULAR | Status: AC
  Filled 2014-11-19: qty 2

## 2014-11-19 MED ORDER — IBUPROFEN 600 MG PO TABS
600.0000 mg | ORAL_TABLET | Freq: Four times a day (QID) | ORAL | Status: DC
  Administered 2014-11-20 – 2014-11-21 (×7): 600 mg via ORAL
  Filled 2014-11-19 (×7): qty 1

## 2014-11-19 MED ORDER — OXYTOCIN 10 UNIT/ML IJ SOLN
INTRAMUSCULAR | Status: AC
  Filled 2014-11-19: qty 4

## 2014-11-19 MED ORDER — LACTATED RINGERS IV SOLN
INTRAVENOUS | Status: DC
  Administered 2014-11-19 (×3): via INTRAVENOUS

## 2014-11-19 MED ORDER — KETOROLAC TROMETHAMINE 30 MG/ML IJ SOLN
INTRAMUSCULAR | Status: AC
  Filled 2014-11-19: qty 1

## 2014-11-19 MED ORDER — NALBUPHINE HCL 10 MG/ML IJ SOLN: 5.0000 mg | Freq: Once | INTRAMUSCULAR | Status: AC | PRN

## 2014-11-19 MED ORDER — CEFAZOLIN SODIUM-DEXTROSE 2-3 GM-% IV SOLR
INTRAVENOUS | Status: AC
  Administered 2014-11-19: 2 g via INTRAVENOUS
  Filled 2014-11-19: qty 50

## 2014-11-19 MED ORDER — SCOPOLAMINE 1 MG/3DAYS TD PT72
1.0000 | MEDICATED_PATCH | Freq: Once | TRANSDERMAL | Status: DC
  Administered 2014-11-19: 1.5 mg via TRANSDERMAL

## 2014-11-19 MED ORDER — ONDANSETRON HCL 4 MG/2ML IJ SOLN
INTRAMUSCULAR | Status: AC
  Filled 2014-11-19: qty 4

## 2014-11-19 MED ORDER — SCOPOLAMINE 1 MG/3DAYS TD PT72
MEDICATED_PATCH | TRANSDERMAL | Status: AC
  Administered 2014-11-19: 1.5 mg via TRANSDERMAL
  Filled 2014-11-19: qty 1

## 2014-11-19 MED ORDER — MORPHINE SULFATE 0.5 MG/ML IJ SOLN
INTRAMUSCULAR | Status: AC
  Filled 2014-11-19: qty 10

## 2014-11-19 MED ORDER — DIPHENHYDRAMINE HCL 25 MG PO CAPS: 25.0000 mg | ORAL_CAPSULE | Freq: Four times a day (QID) | ORAL | Status: DC | PRN

## 2014-11-19 MED ORDER — LANOLIN HYDROUS EX OINT: 1.0000 "application " | TOPICAL_OINTMENT | CUTANEOUS | Status: DC | PRN

## 2014-11-19 MED ORDER — TETANUS-DIPHTH-ACELL PERTUSSIS 5-2.5-18.5 LF-MCG/0.5 IM SUSP: 0.5000 mL | Freq: Once | INTRAMUSCULAR | Status: DC

## 2014-11-19 MED ORDER — DIPHENHYDRAMINE HCL 25 MG PO CAPS: 25.0000 mg | ORAL_CAPSULE | ORAL | Status: DC | PRN

## 2014-11-19 MED ORDER — PHENYLEPHRINE 8 MG IN D5W 100 ML (0.08MG/ML) PREMIX OPTIME
INJECTION | INTRAVENOUS | Status: AC
  Filled 2014-11-19: qty 100

## 2014-11-19 MED ORDER — NALOXONE HCL 0.4 MG/ML IJ SOLN: 0.4000 mg | INTRAMUSCULAR | Status: DC | PRN

## 2014-11-19 MED ORDER — DIBUCAINE 1 % RE OINT
1.0000 | TOPICAL_OINTMENT | RECTAL | Status: DC | PRN
Start: 2014-11-19 — End: 2014-11-21

## 2014-11-19 MED ORDER — KETOROLAC TROMETHAMINE 30 MG/ML IJ SOLN: 30.0000 mg | Freq: Four times a day (QID) | INTRAMUSCULAR | Status: AC | PRN

## 2014-11-19 MED ORDER — SCOPOLAMINE 1 MG/3DAYS TD PT72
1.0000 | MEDICATED_PATCH | Freq: Once | TRANSDERMAL | Status: DC
  Filled 2014-11-19: qty 1

## 2014-11-19 MED ORDER — SIMETHICONE 80 MG PO CHEW
80.0000 mg | CHEWABLE_TABLET | ORAL | Status: DC | PRN
  Administered 2014-11-21: 80 mg via ORAL
  Filled 2014-11-19: qty 1

## 2014-11-19 MED ORDER — ACETAMINOPHEN 325 MG PO TABS: 650.0000 mg | ORAL_TABLET | ORAL | Status: DC | PRN

## 2014-11-19 MED ORDER — LACTATED RINGERS IV SOLN
INTRAVENOUS | Status: DC
  Administered 2014-11-20: 01:00:00 via INTRAVENOUS

## 2014-11-19 MED ORDER — SUMATRIPTAN SUCCINATE 50 MG PO TABS
50.0000 mg | ORAL_TABLET | ORAL | Status: DC | PRN
  Filled 2014-11-19: qty 1

## 2014-11-19 SURGICAL SUPPLY — 31 items
BENZOIN TINCTURE PRP APPL 2/3 (GAUZE/BANDAGES/DRESSINGS) ×2 IMPLANT
CLAMP CORD UMBIL (MISCELLANEOUS) IMPLANT
CLOTH BEACON ORANGE TIMEOUT ST (SAFETY) ×2 IMPLANT
CONTAINER PREFILL 10% NBF 15ML (MISCELLANEOUS) IMPLANT
DRAPE SHEET LG 3/4 BI-LAMINATE (DRAPES) IMPLANT
DRSG OPSITE POSTOP 4X10 (GAUZE/BANDAGES/DRESSINGS) ×2 IMPLANT
DURAPREP 26ML APPLICATOR (WOUND CARE) ×2 IMPLANT
ELECT REM PT RETURN 9FT ADLT (ELECTROSURGICAL) ×2
ELECTRODE REM PT RTRN 9FT ADLT (ELECTROSURGICAL) ×1 IMPLANT
EXTRACTOR VACUUM KIWI (MISCELLANEOUS) IMPLANT
EXTRACTOR VACUUM M CUP 4 TUBE (SUCTIONS) IMPLANT
GLOVE ORTHO TXT STRL SZ7.5 (GLOVE) ×2 IMPLANT
GOWN STRL REUS W/TWL LRG LVL3 (GOWN DISPOSABLE) ×4 IMPLANT
KIT ABG SYR 3ML LUER SLIP (SYRINGE) IMPLANT
NEEDLE HYPO 25X5/8 SAFETYGLIDE (NEEDLE) ×2 IMPLANT
NS IRRIG 1000ML POUR BTL (IV SOLUTION) ×2 IMPLANT
PACK C SECTION WH (CUSTOM PROCEDURE TRAY) ×2 IMPLANT
PAD OB MATERNITY 4.3X12.25 (PERSONAL CARE ITEMS) ×2 IMPLANT
RTRCTR C-SECT PINK 25CM LRG (MISCELLANEOUS) ×2 IMPLANT
STRIP CLOSURE SKIN 1/2X4 (GAUZE/BANDAGES/DRESSINGS) ×2 IMPLANT
SUT CHROMIC 1 CTX 36 (SUTURE) ×4 IMPLANT
SUT PLAIN 0 NONE (SUTURE) IMPLANT
SUT PLAIN 2 0 XLH (SUTURE) IMPLANT
SUT VIC AB 0 CT1 27 (SUTURE) ×2
SUT VIC AB 0 CT1 27XBRD ANBCTR (SUTURE) ×2 IMPLANT
SUT VIC AB 2-0 CT1 (SUTURE) ×2 IMPLANT
SUT VIC AB 2-0 CT1 27 (SUTURE) ×1
SUT VIC AB 2-0 CT1 TAPERPNT 27 (SUTURE) ×1 IMPLANT
SUT VIC AB 4-0 KS 27 (SUTURE) IMPLANT
TOWEL OR 17X24 6PK STRL BLUE (TOWEL DISPOSABLE) ×2 IMPLANT
TRAY FOLEY CATH SILVER 14FR (SET/KITS/TRAYS/PACK) ×2 IMPLANT

## 2014-11-19 NOTE — Lactation Note (Signed)
This note was copied from the chart of Girl Emanuel Zenaida Niecemos. Lactation Consultation Note  Patient Name: Girl Natasha MeadKhidja Eddinger ZOXWR'UToday's Date: 11/19/2014 Reason for consult: Initial assessment Baby 5 hours old. Family holding baby and baby sleeping. Mom states baby has nursed once right after delivery. Enc mom to offer lots of STS and nurse with cues. Enc mom to call for assistance with latching as needed. Mom states that she did not nurse her first child, but she does remember her milk coming in a little later than normal--she thinks due to the fact that she had a hemorrhage with first child. [facility], aware of OP/BFSG and LC phone line assistance after D/C.  Maternal Data Has patient been taught Hand Expression?: Yes (Per mom.) Does the patient have breastfeeding experience prior to this delivery?: No  Feeding Feeding Type: Breast Fed Length of feed: 10 min  LATCH Score/Interventions                      Lactation Tools Discussed/Used     Consult Status Consult Status: Follow-up Date: 11/20/14 Follow-up type: In-patient    Geralynn OchsWILLIARD, Jerlyn Pain 11/19/2014, 5:44 PM

## 2014-11-19 NOTE — Transfer of Care (Signed)
Immediate Anesthesia Transfer of Care Note  Patient: Vanessa Shannon  Procedure(s) Performed: Procedure(s): CESAREAN SECTION (N/A)  Patient Location: PACU  Anesthesia Type:Spinal  Level of Consciousness: awake, alert  and oriented  Airway & Oxygen Therapy: Patient Spontanous Breathing  Post-op Assessment: Report given to RN and Post -op Vital signs reviewed and stable  Post vital signs: Reviewed and stable  Last Vitals:  Filed Vitals:   11/19/14 1056  BP: 133/88  Pulse: 99  Temp: 37 C  Resp: 18    Complications: No apparent anesthesia complications

## 2014-11-19 NOTE — Interval H&P Note (Signed)
History and Physical Interval Note:  11/19/2014 12:00 PM  Vanessa Shannon  has presented today for surgery, with the diagnosis of Repeat C/Section  The various methods of treatment have been discussed with the patient and family. After consideration of risks, benefits and other options for treatment, the patient has consented to  Procedure(s): CESAREAN SECTION (N/A) as a surgical intervention .  The patient's history has been reviewed, patient examined, no change in status, stable for surgery.  I have reviewed the patient's chart and labs.  Questions were answered to the patient's satisfaction.     Mischell Branford D

## 2014-11-19 NOTE — H&P (Signed)
Vanessa Shannon is a 39 y.o. female, G4 P1021, EGA [redacted] weeks with EDC 7-12 presenting for repeat c-section.  Previous LTCS with cervical laceration, prenatal care uncomplicated.  Maternal Medical History:  Fetal activity: Perceived fetal activity is normal.    Prenatal complications: no prenatal complications   OB History    Gravida Para Term Preterm AB TAB SAB Ectopic Multiple Living   4 1   1  1   1      Past Medical History  Diagnosis Date  . GERD (gastroesophageal reflux disease)   . Heartburn during pregnancy   . Headache   . Anemia 2002    postop hemorrhage   Past Surgical History  Procedure Laterality Date  . Cesarean section     Family History: family history includes Cancer in her father; Hypertension in her maternal grandmother and mother; Lupus in her mother; Stroke in her maternal grandmother and paternal grandfather. Social History:  reports that she has never smoked. She does not have any smokeless tobacco history on file. She reports that she drinks alcohol. She reports that she does not use illicit drugs.   Prenatal Transfer Tool  Maternal Diabetes: No Genetic Screening: Normal Maternal Ultrasounds/Referrals: Normal Fetal Ultrasounds or other Referrals:  None Maternal Substance Abuse:  No Significant Maternal Medications:  None Significant Maternal Lab Results:  Lab values include: Group B Strep negative Other Comments:  None  Review of Systems  Respiratory: Negative.   Cardiovascular: Negative.       There were no vitals taken for this visit. Maternal Exam:  Abdomen: Patient reports no abdominal tenderness. Surgical scars: low transverse.   Estimated fetal weight is 7 1/2 lbs.   Fetal presentation: vertex  Introitus: Normal vulva. Normal vagina.    Physical Exam  Constitutional: She appears well-developed and well-nourished.  Neck: Neck supple. No thyromegaly present.  Cardiovascular: Normal rate, regular rhythm and normal heart sounds.   No  murmur heard. Respiratory: Effort normal and breath sounds normal. No respiratory distress. She has no wheezes.  GI: Soft.    Prenatal labs: ABO, Rh: --/--/O POS, O POS (07/01 0900) Antibody: NEG (07/01 0900) Rubella: Immune (04/13 0000) RPR: Non Reactive (07/01 0900)  HBsAg:    HIV: Non-reactive (04/13 0000)  GBS: Negative (04/13 0000)   Assessment/Plan: IUP at 39 weeks with previous c-section with cervical laceration, admitted for repeat c-section.  Procedure and risks have been discussed.   Shandelle Borrelli D 11/19/2014, 7:51 AM

## 2014-11-19 NOTE — Anesthesia Procedure Notes (Signed)
Spinal Patient location during procedure: OR Start time: 11/19/2014 12:20 PM Staffing Anesthesiologist: Mal AmabileFOSTER, Geza Beranek Performed by: anesthesiologist  Preanesthetic Checklist Completed: patient identified, site marked, surgical consent, pre-op evaluation, timeout performed, IV checked, risks and benefits discussed and monitors and equipment checked Spinal Block Patient position: sitting Prep: site prepped and draped and DuraPrep Patient monitoring: heart rate, cardiac monitor, continuous pulse ox and blood pressure Approach: midline Location: L3-4 Injection technique: single-shot Needle Needle type: Sprotte  Needle gauge: 24 G Needle length: 9 cm Needle insertion depth: 6 cm Assessment Sensory level: T4 Additional Notes Patient tolerated procedure well. Adequate sensory level.

## 2014-11-19 NOTE — Op Note (Signed)
Preoperative diagnosis: Intrauterine pregnancy at 39 weeks, previous c-section Postoperative diagnosis: Same Procedure: Repeat low transverse cesarean section without extensions Surgeon: Lavina Hammanodd Ayiden Milliman M.D. Assistant:  Karmen Stabsracie Sumner, RNFA Anesthesia: Spinal  Findings: Patient had normal gravid anatomy and delivered a viable female infant with Apgars of 9 and 9 weight pending Estimated blood loss: 800 cc Specimens: Placenta sent to labor and delivery Complications: None  Procedure in detail: The patient was taken to the operating room and placed in the sitting position. Dr. Malen GauzeFoster instilled spinal anesthesia.  She was then placed in the dorsosupine position with left tilt. Abdomen was then prepped and draped in the usual sterile fashion, and a foley catheter was inserted. The level of her anesthesia was found to be adequate. Abdomen was entered via a standard Pfannenstiel incision through her previous scar. Once the peritoneal cavity was entered the Alexis disposable self-retaining retractor was placed and good visualization was achieved. A 4 cm transverse incision was then made in the lower uterine segment pushing the bladder inferior. Once the uterine cavity was entered the incision was extended digitally. The fetal vertex was grasped and delivered through the incision atraumatically. Mouth and nares were suctioned. The remainder of the infant then delivered atraumatically. Cord was doubly clamped and cut and the infant handed to the awaiting pediatric team. Cord blood was obtained. The placenta delivered spontaneously. Uterus was wiped dry with clean lap pad and all clots and debris were removed. Uterine incision was inspected and found to be free of extensions. Uterine incision was closed in 1 layer with running #1 Chromic. Bleeding from the right angle was controlled with 2 figure 8 sutures of ) Chromic.  Tubes and ovaries were inspected and found to be normal. Uterine incision was inspected and  found to be hemostatic. Bleeding from serosal edges was controlled with electrocautery. The Alexis retractor was removed. Subfascial space was irrigated and made hemostatic with electrocautery. Peritoneum was closed with 2-0 Vicryl.  Fascia was closed in running fashion starting at both ends and meeting in the middle with 0 Vicryl. Subcutaneous tissue was then irrigated and made hemostatic with electrocautery, then closed with running 2-0 plain gut. Skin was closed with running 4-0 Vicryl subcuticular suture followed by steri-strips and a sterile dressing. Patient tolerated the procedure well and was taken to the recovery in stable condition. Counts were correct x2, she received Ancef 2 g IV at the beginning of the procedure and she had PAS hose on throughout the procedure.

## 2014-11-20 ENCOUNTER — Encounter (HOSPITAL_COMMUNITY): Payer: Self-pay | Admitting: Obstetrics and Gynecology

## 2014-11-20 LAB — CBC
HCT: 30.5 % — ABNORMAL LOW (ref 36.0–46.0)
Hemoglobin: 10.1 g/dL — ABNORMAL LOW (ref 12.0–15.0)
MCH: 32.4 pg (ref 26.0–34.0)
MCHC: 33.1 g/dL (ref 30.0–36.0)
MCV: 97.8 fL (ref 78.0–100.0)
Platelets: 153 10*3/uL (ref 150–400)
RBC: 3.12 MIL/uL — AB (ref 3.87–5.11)
RDW: 13.5 % (ref 11.5–15.5)
WBC: 10.9 10*3/uL — AB (ref 4.0–10.5)

## 2014-11-20 LAB — BIRTH TISSUE RECOVERY COLLECTION (PLACENTA DONATION)

## 2014-11-20 NOTE — Progress Notes (Signed)
Subjective: Postpartum Day #1: Cesarean Delivery Patient reports incisional pain, tolerating PO and no problems voiding.    Objective: Vital signs in last 24 hours: Temp:  [97.5 F (36.4 C)-99 F (37.2 C)] 98.7 F (37.1 C) (07/06 1207) Pulse Rate:  [67-90] 83 (07/06 1207) Resp:  [17-25] 20 (07/06 1207) BP: (95-134)/(59-81) 123/60 mmHg (07/06 1207) SpO2:  [94 %-100 %] 99 % (07/06 1207) Weight:  [92.08 kg (203 lb)] 92.08 kg (203 lb) (07/05 1600)  Physical Exam:  General: alert Lochia: appropriate Uterine Fundus: firm Incision: dressing C/D/I   Recent Labs  11/20/14 0531  HGB 10.1*  HCT 30.5*    Assessment/Plan: Status post Cesarean section. Doing well postoperatively.  Continue current care, ambulate.  Vanessa Shannon D 11/20/2014, 1:21 PM

## 2014-11-20 NOTE — Anesthesia Postprocedure Evaluation (Signed)
  Anesthesia Post-op Note  Patient: Vanessa Shannon  Procedure(s) Performed: Procedure(s): CESAREAN SECTION (N/A)  Patient Location: PACU  Anesthesia Type:Spinal  Level of Consciousness: awake, alert  and oriented  Airway and Oxygen Therapy: Patient Spontanous Breathing  Post-op Pain: none  Post-op Assessment: Post-op Vital signs reviewed, Patient's Cardiovascular Status Stable, Respiratory Function Stable, Patent Airway, No signs of Nausea or vomiting, Pain level controlled, No headache, No backache, Spinal receding and Patient able to bend at knees LLE Motor Response: Purposeful movement LLE Sensation: Tingling RLE Motor Response: Purposeful movement RLE Sensation: Increased      Post-op Vital Signs: Reviewed and stable   Complications: No apparent anesthesia complications

## 2014-11-20 NOTE — Lactation Note (Signed)
This note was copied from the chart of Girl Christina Zenaida Niecemos. Lactation Consultation Note Follow up visit at 31 hour of age.  & feedings in the past 24 hours with 2 voids and 5 stools.  Visitor holding baby.  Mom reports baby is wanting more, she isn't sure she has enough.  Encouraged mom cluster feedings is normal and helps with milk supply.  Baby should breastfeed at least 8 times in 24 hours or more.  Baby showing feeding cues.  Mom is able to demonstrate hand expression with drops noted.  Assisted with modified cradle hold, but baby buried face into mom and body was twisted.  Offered to assist with football hold and baby latched well with wide flanged lips strong rhythmic sucking for several minutes.  Mom to call for assist as needed.   Patient Name: Girl Natasha MeadKhidja Lopiccolo ZOXWR'UToday's Date: 11/20/2014 Reason for consult: Follow-up assessment   Maternal Data Has patient been taught Hand Expression?: Yes  Feeding Feeding Type: Breast Fed Length of feed:  (several minutes)  LATCH Score/Interventions Latch: Grasps breast easily, tongue down, lips flanged, rhythmical sucking.  Audible Swallowing: A few with stimulation Intervention(s): Hand expression  Type of Nipple: Everted at rest and after stimulation  Comfort (Breast/Nipple): Soft / non-tender     Hold (Positioning): Assistance needed to correctly position infant at breast and maintain latch. Intervention(s): Breastfeeding basics reviewed;Support Pillows;Position options  LATCH Score: 8  Lactation Tools Discussed/Used     Consult Status Consult Status: Follow-up Date: 11/21/14 Follow-up type: In-patient    Laira Penninger, Arvella MerlesJana Lynn 11/20/2014, 8:32 PM

## 2014-11-21 MED ORDER — OXYCODONE-ACETAMINOPHEN 5-325 MG PO TABS: 1.0000 | ORAL_TABLET | ORAL | Status: DC | PRN

## 2014-11-21 MED ORDER — IBUPROFEN 600 MG PO TABS: 600.0000 mg | ORAL_TABLET | Freq: Four times a day (QID) | ORAL | Status: DC | PRN

## 2014-11-21 NOTE — Discharge Instructions (Signed)
As per discharge pamphlet °

## 2014-11-21 NOTE — Discharge Summary (Signed)
Obstetric Discharge Summary Reason for Admission: cesarean section Prenatal Procedures: none Intrapartum Procedures: cesarean: low cervical, transverse Postpartum Procedures: none Complications-Operative and Postpartum: none HEMOGLOBIN  Date Value Ref Range Status  11/20/2014 10.1* 12.0 - 15.0 g/dL Final   HCT  Date Value Ref Range Status  11/20/2014 30.5* 36.0 - 46.0 % Final    Physical Exam:  General: alert Lochia: appropriate Uterine Fundus: firm Incision: healing well   Discharge Diagnoses: Term Pregnancy-delivered  Discharge Information: Date: 11/21/2014 Activity: pelvic rest and no strenuous activity Diet: routine Medications: Ibuprofen and Percocet Condition: stable Instructions: refer to practice specific booklet Discharge to: home Follow-up Information    Follow up with Michale Emmerich D, MD. Schedule an appointment as soon as possible for a visit in 2 weeks.   Specialty:  Obstetrics and Gynecology   Contact information:   [address], [address] [place] [id number] [phone]       Newborn Data: Live born female  Birth Weight: 6 lb 8.2 oz (2954 g) APGAR: 8, 9  Home with mother.  Vanessa Shannon 11/21/2014, 8:27 AM

## 2014-11-21 NOTE — Lactation Note (Addendum)
This note was copied from the chart of Vanessa Shannon. Lactation Consultation Note  Patient Name: Vanessa Shannon ZOXWR'UToday's Date: 11/21/2014 Reason for consult: Follow-up assessment Baby 44 hours old. Mom reports nursing going well and she is seeing milk around baby's mouth after nursing. Mom states her breasts are starting to fill as well. Enc mom to continue to nurse with cues. Parents state that they discussed with pediatrician the baby's 8% weight loss and length of time since last stool. Mom [facility]. Mom aware of OP/BFSG and LC phone line assistance after D/C.  Maternal Data    Feeding Feeding Type: Breast Fed Length of feed: 5 min  LATCH Score/Interventions                      Lactation Tools Discussed/Used     Consult Status Consult Status: PRN    Geralynn OchsWILLIARD, Deniyah Dillavou 11/21/2014, 9:16 AM

## 2014-11-21 NOTE — Progress Notes (Signed)
POD #2 Doing well, wants to go home Afeb, VSS Abd- soft, fundus firm, incision intact D/c home 

## 2014-11-23 LAB — TYPE AND SCREEN
ABO/RH(D): O POS
ANTIBODY SCREEN: NEGATIVE
UNIT DIVISION: 0
UNIT DIVISION: 0

## 2015-02-27 ENCOUNTER — Encounter: Payer: Self-pay | Admitting: Family Medicine

## 2015-02-27 ENCOUNTER — Ambulatory Visit (INDEPENDENT_AMBULATORY_CARE_PROVIDER_SITE_OTHER): Payer: 59 | Admitting: Family Medicine

## 2015-02-27 VITALS — BP 142/98 | HR 94 | Temp 98.2°F | Ht 64.0 in | Wt 187.6 lb

## 2015-02-27 DIAGNOSIS — I10 Essential (primary) hypertension: Secondary | ICD-10-CM

## 2015-02-27 DIAGNOSIS — G43009 Migraine without aura, not intractable, without status migrainosus: Secondary | ICD-10-CM

## 2015-02-27 DIAGNOSIS — Z23 Encounter for immunization: Secondary | ICD-10-CM

## 2015-02-27 MED ORDER — PROPRANOLOL HCL 40 MG PO TABS: 40.0000 mg | ORAL_TABLET | Freq: Two times a day (BID) | ORAL | Status: DC

## 2015-02-27 NOTE — Progress Notes (Signed)
HPI:  Vanessa Shannon is a pleasant 39 yo F whom had a baby recently several months ago, and is here for follow up on:  HTN: -started a bout 1 week prior to delivery, breastfeeding, on micronor -reports intermittently elevated since -feels fine -did gain weight with pregnancy and has not lost this -denies: CP, SOB, DOE -hx of migraines and does still struggle with these and her OB said it is ok to take imitrex  ROS: See pertinent positives and negatives per HPI.  Past Medical History  Diagnosis Date  . GERD (gastroesophageal reflux disease)   . Heartburn during pregnancy   . Headache   . Anemia 2002    postop hemorrhage    Past Surgical History  Procedure Laterality Date  . Cesarean section    . Cesarean section N/A 11/19/2014    Procedure: CESAREAN SECTION;  Surgeon: Lavina Hammanodd Meisinger, MD;  Location: [facility];  Service: Obstetrics;  Laterality: N/A;    Family History  Problem Relation Age of Onset  . Lupus Mother   . Hypertension Mother   . Cancer Father     brain cancer  . Stroke Maternal Grandmother   . Hypertension Maternal Grandmother   . Stroke Paternal Grandfather     Social History   Social History  . Marital Status: Single    Spouse Name: N/A  . Number of Children: N/A  . Years of Education: N/A   Social History Main Topics  . Smoking status: Never Smoker   . Smokeless tobacco: None  . Alcohol Use: Yes     Comment: occ/social   . Drug Use: No  . Sexual Activity: Yes   Other Topics Concern  . 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  acetaminophen (TYLENOL) 500 MG tablet, Take 500 mg by mouth every 6 (six) hours as needed., Disp: , Rfl:  .  diphenhydrAMINE  (BENADRYL) 25 MG tablet, Take 25 mg by mouth every 6 (six) hours as needed., Disp: , Rfl:  .  ibuprofen (ADVIL,MOTRIN) 600 MG tablet, Take 1 tablet (600 mg total) by mouth every 6 (six) hours as needed for mild pain., Disp: 30 tablet, Rfl: 0 .  Prenatal Vit-Fe Fumarate-FA (MULTIVITAMIN-PRENATAL) 27-0.8 MG TABS tablet, Take 1 tablet by mouth daily at 12 noon., Disp: , Rfl:  .  SUMAtriptan (IMITREX) 50 MG tablet, Take 1 at the onset of migraine then can take one more in 2 hours if needed. No more then 2 in 24 hours., Disp: 10 tablet, Rfl: 0 .  propranolol (INDERAL) 40 MG tablet, Take 1 tablet (40 mg total) by mouth 2 (two) times daily., Disp: 60 tablet, Rfl: 3  EXAM:  Filed Vitals:   02/27/15 0855  BP: 142/98  Pulse: 94  Temp: 98.2 F (36.8 C)    Body mass index is 32.19 kg/(m^2).  GENERAL: vitals reviewed and listed above, alert, oriented, appears well hydrated and in no acute distress  HEENT: atraumatic, conjunttiva clear, no obvious abnormalities on inspection of external nose and ears  NECK: no obvious masses on inspection  LUNGS: clear to auscultation bilaterally, no  wheezes, rales or rhonchi, good air movement  CV: HRRR, no peripheral edema  MS: moves all extremities without noticeable abnormality  PSYCH: pleasant and cooperative, no obvious depression or anxiety  ASSESSMENT AND PLAN:  Discussed the following assessment and plan:  Essential hypertension  Migraine without aura and without status migrainosus, not intractable  Lactating mother  -discussed risks of various medications and safety data on different agents -she opted to try propranolol for HTN and migraine prophylaxis, starting at a low dose then titrating up -follow up in 1 month or sooner if needed -Patient advised to return or notify a doctor immediately if symptoms worsen or persist or new concerns arise.  Patient Instructions  BEFORE YOU LEAVE: -flu shot -schedule follow up in 1 month  Start the  propranolol  -As we discussed, we have prescribed a new medication for you at this appointment. We discussed the common and serious potential adverse effects of this medication and you can review these and more with the pharmacist when you pick up your medication.  Please follow the instructions for use carefully and notify us immediately if you have any problems taking this medication.  We recommend the following healthy lifestyle measures: - eat a healthy whole foods diet consisting of regular small meals composed of vegetables, fruits, beans, nuts, seeds, healthy meats such as white chicken and fish and whole grains.  - avoid sweets, white starchy foods, fried foods, fast food, processed foods, sodas, red meet and other fattening foods.  - get a least 150-300 minutes of aerobic exercise per week.       Kriste Basque R.

## 2015-02-27 NOTE — Patient Instructions (Signed)
BEFORE YOU LEAVE: -flu shot -schedule follow up in 1 month  Start the propranolol  -As we discussed, we have prescribed a new medication for you at this appointment. We discussed the common and serious potential adverse effects of this medication and you can review these and more with the pharmacist when you pick up your medication.  Please follow the instructions for use carefully and notify us immediately if you have any problems taking this medication.  We recommend the following healthy lifestyle measures: - eat a healthy whole foods diet consisting of regular small meals composed of vegetables, fruits, beans, nuts, seeds, healthy meats such as white chicken and fish and whole grains.  - avoid sweets, white starchy foods, fried foods, fast food, processed foods, sodas, red meet and other fattening foods.  - get a least 150-300 minutes of aerobic exercise per week.

## 2015-02-27 NOTE — Progress Notes (Signed)
Pre visit review using our clinic review tool, if applicable. No additional management support is needed unless otherwise documented below in the visit note. 

## 2016-03-23 ENCOUNTER — Inpatient Hospital Stay (HOSPITAL_COMMUNITY)
Admission: AD | Admit: 2016-03-23 | Discharge: 2016-03-23 | Disposition: A | Discharge status: Home / Self Care | Payer: 59 | Source: Ambulatory Visit | Attending: Family Medicine | Admitting: Family Medicine

## 2016-03-23 ENCOUNTER — Encounter (HOSPITAL_COMMUNITY): Payer: Self-pay | Admitting: *Deleted

## 2016-03-23 DIAGNOSIS — R1031 Right lower quadrant pain: Secondary | ICD-10-CM | POA: Diagnosis not present

## 2016-03-23 DIAGNOSIS — Z3A34 34 weeks gestation of pregnancy: Secondary | ICD-10-CM | POA: Insufficient documentation

## 2016-03-23 DIAGNOSIS — O26893 Other specified pregnancy related conditions, third trimester: Secondary | ICD-10-CM | POA: Diagnosis not present

## 2016-03-23 DIAGNOSIS — Z8759 Personal history of other complications of pregnancy, childbirth and the puerperium: Secondary | ICD-10-CM

## 2016-03-23 DIAGNOSIS — Z98891 History of uterine scar from previous surgery: Secondary | ICD-10-CM

## 2016-03-23 DIAGNOSIS — O0933 Supervision of pregnancy with insufficient antenatal care, third trimester: Secondary | ICD-10-CM | POA: Diagnosis not present

## 2016-03-23 DIAGNOSIS — O34219 Maternal care for unspecified type scar from previous cesarean delivery: Secondary | ICD-10-CM | POA: Insufficient documentation

## 2016-03-23 DIAGNOSIS — R1011 Right upper quadrant pain: Secondary | ICD-10-CM | POA: Diagnosis not present

## 2016-03-23 DIAGNOSIS — E860 Dehydration: Secondary | ICD-10-CM

## 2016-03-23 DIAGNOSIS — O09523 Supervision of elderly multigravida, third trimester: Secondary | ICD-10-CM | POA: Diagnosis not present

## 2016-03-23 DIAGNOSIS — R109 Unspecified abdominal pain: Secondary | ICD-10-CM | POA: Diagnosis not present

## 2016-03-23 DIAGNOSIS — O26899 Other specified pregnancy related conditions, unspecified trimester: Secondary | ICD-10-CM

## 2016-03-23 HISTORY — DX: History of uterine scar from previous surgery: Z98.891

## 2016-03-23 LAB — DIFFERENTIAL
BASOS ABS: 0 10*3/uL (ref 0.0–0.1)
Basophils Relative: 0 %
Eosinophils Absolute: 0.1 10*3/uL (ref 0.0–0.7)
Eosinophils Relative: 1 %
LYMPHS ABS: 2.5 10*3/uL (ref 0.7–4.0)
LYMPHS PCT: 26 %
Monocytes Absolute: 0.7 10*3/uL (ref 0.1–1.0)
Monocytes Relative: 7 %
NEUTROS ABS: 6.4 10*3/uL (ref 1.7–7.7)
Neutrophils Relative %: 66 %

## 2016-03-23 LAB — URINALYSIS, ROUTINE W REFLEX MICROSCOPIC
Bilirubin Urine: NEGATIVE
GLUCOSE, UA: NEGATIVE mg/dL
Hgb urine dipstick: NEGATIVE
Ketones, ur: >80 mg/dL — AB
Leukocytes, UA: NEGATIVE
Nitrite: NEGATIVE
Protein, ur: NEGATIVE mg/dL
SPECIFIC GRAVITY, URINE: 1.015 (ref 1.005–1.030)
pH: 6 (ref 5.0–8.0)

## 2016-03-23 LAB — COMPREHENSIVE METABOLIC PANEL
ALBUMIN: 3.3 g/dL — AB (ref 3.5–5.0)
ALT: 11 U/L — ABNORMAL LOW (ref 14–54)
AST: 15 U/L (ref 15–41)
Alkaline Phosphatase: 71 U/L (ref 38–126)
Anion gap: 11 (ref 5–15)
BILIRUBIN TOTAL: 0.4 mg/dL (ref 0.3–1.2)
BUN: 6 mg/dL (ref 6–20)
CALCIUM: 9 mg/dL (ref 8.9–10.3)
CO2: 21 mmol/L — ABNORMAL LOW (ref 22–32)
Chloride: 103 mmol/L (ref 101–111)
Creatinine, Ser: 0.47 mg/dL (ref 0.44–1.00)
GFR calc Af Amer: >60 mL/min (ref >60)
GFR calc non Af Amer: >60 mL/min (ref >60)
GLUCOSE: 80 mg/dL (ref 65–99)
Potassium: 3.6 mmol/L (ref 3.5–5.1)
SODIUM: 135 mmol/L (ref 135–145)
Total Protein: 7.2 g/dL (ref 6.5–8.1)

## 2016-03-23 LAB — CBC
HCT: 34.3 % — ABNORMAL LOW (ref 36.0–46.0)
HEMOGLOBIN: 11.5 g/dL — AB (ref 12.0–15.0)
MCH: 31.5 pg (ref 26.0–34.0)
MCHC: 33.5 g/dL (ref 30.0–36.0)
MCV: 94 fL (ref 78.0–100.0)
PLATELETS: 219 10*3/uL (ref 150–400)
RBC: 3.65 MIL/uL — ABNORMAL LOW (ref 3.87–5.11)
RDW: 13.2 % (ref 11.5–15.5)
WBC: 9.6 10*3/uL (ref 4.0–10.5)

## 2016-03-23 LAB — HEPATITIS B SURFACE ANTIGEN: Hepatitis B Surface Ag: NEGATIVE

## 2016-03-23 LAB — WET PREP, GENITAL
Clue Cells Wet Prep HPF POC: NONE SEEN
SPERM: NONE SEEN
Trich, Wet Prep: NONE SEEN
Yeast Wet Prep HPF POC: NONE SEEN

## 2016-03-23 MED ORDER — PRENATAL PLUS 27-1 MG PO TABS: 1.0000 | ORAL_TABLET | Freq: Every day | ORAL | 0 refills | Status: DC

## 2016-03-23 NOTE — Discharge Instructions (Signed)
Round Ligament Pain °The round ligament is a cord of muscle and tissue that helps to support the uterus. It can become a source of pain during pregnancy if it becomes stretched or twisted as the baby grows. The pain usually begins in the second trimester of pregnancy, and it can come and go until the baby is delivered. It is not a serious problem, and it does not cause harm to the baby. °Round ligament pain is usually a short, sharp, and pinching pain, but it can also be a dull, lingering, and aching pain. The pain is felt in the lower side of the abdomen or in the groin. It usually starts deep in the groin and moves up to the outside of the hip area. Pain can occur with: °· A sudden change in position. °· Rolling over in bed. °· Coughing or sneezing. °· Physical activity. °HOME CARE INSTRUCTIONS °Watch your condition for any changes. Take these steps to help with your pain: °· When the pain starts, relax. Then try: °· Sitting down. °· Flexing your knees up to your abdomen. °· Lying on your side with one pillow under your abdomen and another pillow between your legs. °· Sitting in a warm bath for 15-20 minutes or until the pain goes away. °· Take over-the-counter and prescription medicines only as told by your health care provider. °· Move slowly when you sit and stand. °· Avoid long walks if they cause pain. °· Stop or lessen your physical activities if they cause pain. °SEEK MEDICAL CARE IF: °· Your pain does not go away with treatment. °· You feel pain in your back that you did not have before. °· Your medicine is not helping. °SEEK IMMEDIATE MEDICAL CARE IF: °· You develop a fever or chills. °· You develop uterine contractions. °· You develop vaginal bleeding. °· You develop nausea or vomiting. °· You develop diarrhea. °· You have pain when you urinate. °  °This information is not intended to replace advice given to you by your health care provider. Make sure you discuss any questions you have with your health  care provider. °  °Document Released: 02/10/2008 Document Revised: 07/26/2011 Document Reviewed: 07/10/2014 °Elsevier Interactive Patient Education ©2016 Elsevier Inc. ° °Abdominal Pain During Pregnancy °Abdominal pain is common in pregnancy. Most of the time, it does not cause harm. There are many causes of abdominal pain. Some causes are more serious than others. Some of the causes of abdominal pain in pregnancy are easily diagnosed. Occasionally, the diagnosis takes time to understand. Other times, the cause is not determined. Abdominal pain can be a sign that something is very wrong with the pregnancy, or the pain may have nothing to do with the pregnancy at all. For this reason, always tell your health care provider if you have any abdominal discomfort. °HOME CARE INSTRUCTIONS  °Monitor your abdominal pain for any changes. The following actions may help to alleviate any discomfort you are experiencing: °· Do not have sexual intercourse or put anything in your vagina until your symptoms go away completely. °· Get plenty of rest until your pain improves. °· Drink clear fluids if you feel nauseous. Avoid solid food as long as you are uncomfortable or nauseous. °· Only take over-the-counter or prescription medicine as directed by your health care provider. °· Keep all follow-up appointments with your health care provider. °SEEK IMMEDIATE MEDICAL CARE IF: °· You are bleeding, leaking fluid, or passing tissue from the vagina. °· You have increasing pain or cramping. °·   You have persistent vomiting. °· You have painful or bloody urination. °· You have a fever. °· You notice a decrease in your baby's movements. °· You have extreme weakness or feel faint. °· You have shortness of breath, with or without abdominal pain. °· You develop a severe headache with abdominal pain. °· You have abnormal vaginal discharge with abdominal pain. °· You have persistent diarrhea. °· You have abdominal pain that continues even after  rest, or gets worse. °MAKE SURE YOU:  °· Understand these instructions. °· Will watch your condition. °· Will get help right away if you are not doing well or get worse. °  °This information is not intended to replace advice given to you by your health care provider. Make sure you discuss any questions you have with your health care provider. °  °Document Released: 05/03/2005 Document Revised: 02/21/2013 Document Reviewed: 11/30/2012 °Elsevier Interactive Patient Education ©2016 Elsevier Inc. ° °

## 2016-03-23 NOTE — MAU Note (Signed)
Pt C/O sharp pain in RLQ & around umbilicus since yesterday, thinks they are braxton hicks contractions.  Denies bleeding or LOF.  No PNC.

## 2016-03-23 NOTE — MAU Provider Note (Signed)
History     CSN: [id number]  Arrival date and time: 03/23/16 1659      Chief Complaint  Patient presents with  . Abdominal Pain   Vanessa Shannon is a 40 y.o. [id number] at 5030w3d with NPC presenting with periumbilical and right groin pain for about 2 weeks. She thinks the pain is due to Braxton-Hicks contractions and is different from RLP she has had.     Abdominal Pain  This is a new problem. The current episode started 1 to 4 weeks ago. The onset quality is undetermined. The problem occurs intermittently. The problem has been gradually worsening. The pain is located in the RLQ and epigastric region. The pain is at a severity of 5/10. The pain is mild. The quality of the pain is sharp. The abdominal pain does not radiate. Pertinent negatives include no anorexia, constipation, diarrhea, dysuria, fever, frequency, hematuria, myalgias, nausea or vomiting. Nothing aggravates the pain. The pain is relieved by nothing. She has tried nothing for the symptoms. Her past medical history is significant for abdominal surgery. C/S x2   Pregnancy Course: NPC. 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 Has 5916 month old. LMP sure. Only known pregnancy complication was an instance of vaginal spotting about a month ago.    OB History  Gravida Para Term Preterm AB Living  5 2 2   2 2   SAB TAB Ectopic Multiple Live Births  1     0 2    # Outcome Date GA Lbr Len/2nd Weight Sex Delivery Anes PTL Lv  5 Current           4 Term 11/19/14 7213w0d  2.954 kg (6 lb 8.2 oz) F CS-LTranv Spinal  LIV  3 Term 08/16/00     CS-LTranv     2 AB           1 SAB               Past Medical History:  Diagnosis Date  . Anemia 2002   postop hemorrhage  . GERD (gastroesophageal reflux disease)   . Headache   . Heartburn during pregnancy   PMH addendum: States HTN diagnosed with 2016 pregnancy at term and was on Inderal from PMD for about 2 months after her 6wk PP check; then was D/C'd and BPs in normal range since then.   Past  Surgical History:  Procedure Laterality Date  . CESAREAN SECTION    . CESAREAN SECTION N/A 11/19/2014   Procedure: CESAREAN SECTION;  Surgeon: Lavina Hammanodd Meisinger, MD;  Location: [facility];  Service: Obstetrics;  Laterality: N/A;    Family History  Problem Relation Age of Onset  . Lupus Mother   . Hypertension Mother   . Cancer Father     brain cancer  . Stroke Maternal Grandmother   . Hypertension Maternal Grandmother   . Stroke Paternal Grandfather     Social History  Substance Use Topics  . Smoking status: Never Smoker  . Smokeless tobacco: Never Used  . Alcohol use Yes     Comment: occ/social     Allergies: No Known Allergies  Prescriptions Prior to Admission  Medication Sig Dispense Refill Last Dose  . acetaminophen (TYLENOL) 500 MG tablet Take 500 mg by mouth every 6 (six) hours as needed.   Taking  . diphenhydrAMINE (BENADRYL) 25 MG tablet Take 25 mg by mouth every 6 (six) hours as needed.   Taking  . ibuprofen (ADVIL,MOTRIN) 600 MG tablet Take 1 tablet (600  mg total) by mouth every 6 (six) hours as needed for mild pain. 30 tablet 0 Taking  . Prenatal Vit-Fe Fumarate-FA (MULTIVITAMIN-PRENATAL) 27-0.8 MG TABS tablet Take 1 tablet by mouth daily at 12 noon.   Taking  . propranolol (INDERAL) 40 MG tablet Take 1 tablet (40 mg total) by mouth 2 (two) times daily. 60 tablet 3   . SUMAtriptan (IMITREX) 50 MG tablet Take 1 at the onset of migraine then can take one more in 2 hours if needed. No more then 2 in 24 hours. 10 tablet 0 Taking    Review of Systems  Constitutional: Negative for chills, fever and malaise/fatigue.  Respiratory: Negative for cough.   Gastrointestinal: Positive for abdominal pain. Negative for anorexia, constipation, diarrhea, nausea and vomiting.  Genitourinary: Negative for dysuria, flank pain, frequency, hematuria and urgency.  Musculoskeletal: Negative for myalgias.  Neurological: Negative for dizziness.  Psychiatric/Behavioral: Negative for depression.    Physical Exam   Blood pressure 120/86, pulse 94, temperature 98.5 F (36.9 C), temperature source Oral, resp. rate 16, height 5\' 4"  (1.626 m), weight 86.6 kg (191 lb), last menstrual period 07/26/2015, unknown if currently breastfeeding.  Physical Exam  Nursing note and vitals reviewed. Constitutional: She is oriented to person, place, and time. She appears well-developed and well-nourished. No distress.  HENT:  Head: Normocephalic.  Eyes: Pupils are equal, round, and reactive to light.  Neck: Normal range of motion.  Cardiovascular: Normal rate.   Respiratory: Effort normal.  GI: Soft. There is no tenderness. There is no rebound and no guarding.  Size consistent with 34 wks  Genitourinary: Vaginal discharge found.  Musculoskeletal: Normal range of motion.  Neurological: She is alert and oriented to person, place, and time.  Skin: Skin is warm and dry.  Psychiatric: She has a normal mood and affect. Her behavior is normal. Judgment and thought content normal.    MAU Course  Procedures Results for orders placed or performed during the hospital encounter of 03/23/16 (from the past 24 hour(s))  Urinalysis, Routine w reflex microscopic (not at [facility])     Status: Abnormal   Collection Time: 03/23/16  5:45 PM  Result Value Ref Range   Color, Urine YELLOW YELLOW   APPearance CLEAR CLEAR   Specific Gravity, Urine 1.015 1.005 - 1.030   pH 6.0 5.0 - 8.0   Glucose, UA NEGATIVE NEGATIVE mg/dL   Hgb urine dipstick NEGATIVE NEGATIVE   Bilirubin Urine NEGATIVE NEGATIVE   Ketones, ur >80 (A) NEGATIVE mg/dL   Protein, ur NEGATIVE NEGATIVE mg/dL   Nitrite NEGATIVE NEGATIVE   Leukocytes, UA NEGATIVE NEGATIVE  Wet prep, genital     Status: Abnormal   Collection Time: 03/23/16  6:45 PM  Result Value Ref Range   Yeast Wet Prep HPF POC NONE SEEN NONE SEEN   Trich, Wet Prep NONE SEEN NONE SEEN   Clue Cells Wet Prep HPF POC NONE SEEN NONE SEEN   WBC, Wet Prep HPF POC MODERATE (A) NONE SEEN    Sperm NONE SEEN   CBC     Status: Abnormal   Collection Time: 03/23/16  7:11 PM  Result Value Ref Range   WBC 9.6 4.0 - 10.5 K/uL   RBC 3.65 (L) 3.87 - 5.11 MIL/uL   Hemoglobin 11.5 (L) 12.0 - 15.0 g/dL   HCT 16.1 (L) 09.6 - 04.5 %   MCV 94.0 78.0 - 100.0 fL   MCH 31.5 26.0 - 34.0 pg   MCHC 33.5 30.0 - 36.0 g/dL  RDW 13.2 11.5 - 15.5 %   Platelets 219 150 - 400 K/uL  Differential     Status: None   Collection Time: 03/23/16  7:11 PM  Result Value Ref Range   Neutrophils Relative % 66 %   Neutro Abs 6.4 1.7 - 7.7 K/uL   Lymphocytes Relative 26 %   Lymphs Abs 2.5 0.7 - 4.0 K/uL   Monocytes Relative 7 %   Monocytes Absolute 0.7 0.1 - 1.0 K/uL   Eosinophils Relative 1 %   Eosinophils Absolute 0.1 0.0 - 0.7 K/uL   Basophils Relative 0 %   Basophils Absolute 0.0 0.0 - 0.1 K/uL  Comprehensive metabolic panel     Status: Abnormal   Collection Time: 03/23/16  7:11 PM  Result Value Ref Range   Sodium 135 135 - 145 mmol/L   Potassium 3.6 3.5 - 5.1 mmol/L   Chloride 103 101 - 111 mmol/L   CO2 21 (L) 22 - 32 mmol/L   Glucose, Bld 80 65 - 99 mg/dL   BUN 6 6 - 20 mg/dL   Creatinine, Ser 1.610.47 0.44 - 1.00 mg/dL   Calcium 9.0 8.9 - 09.610.3 mg/dL   Total Protein 7.2 6.5 - 8.1 g/dL   Albumin 3.3 (L) 3.5 - 5.0 g/dL   AST 15 15 - 41 U/L   ALT 11 (L) 14 - 54 U/L   Alkaline Phosphatase 71 38 - 126 U/L   Total Bilirubin 0.4 0.3 - 1.2 mg/dL   GFR calc non Af Amer >60 >60 mL/min   GFR calc Af Amer >60 >60 mL/min   Anion gap 11 5 - 15   OB labs sent  EFM: 135-140, moderate variability, reactive, no decelerations Toco: occ mild UC  MDM Will discharge home with reassurance pain likely from B-H and RLP. Advised to push po fluids and no meal skipping.   Assessment and Plan  Abdominal pain during pregnancy in third trimester  Insufficient prenatal care in third trimester  AMA (advanced maternal age) multigravida 35+, third trimester  Uterine scar from previous cesarean delivery affecting  pregnancy  Antepartum dehydration    Medication List    STOP taking these medications   diphenhydrAMINE 25 MG tablet Commonly known as:  BENADRYL   ibuprofen 600 MG tablet Commonly known as:  ADVIL,MOTRIN   multivitamin-prenatal 27-0.8 MG Tabs tablet Replaced by:  prenatal vitamin w/FE, FA 27-1 MG Tabs tablet   propranolol 40 MG tablet Commonly known as:  INDERAL   SUMAtriptan 50 MG tablet Commonly known as:  IMITREX     TAKE these medications   acetaminophen 500 MG tablet Commonly known as:  TYLENOL Take 500 mg by mouth every 6 (six) hours as needed.   prenatal vitamin w/FE, FA 27-1 MG Tabs tablet Take 1 tablet by mouth daily. Replaces:  multivitamin-prenatal 27-0.8 MG Tabs tablet      Follow-up Information    [facility].   Specialty:  Obstetrics and Gynecology Why:  Someone from the office will call you with appointment Contact information: [address] [place] [phone]        Outpatient MFM US to be scheduled  Hall Birchard 03/23/2016, 7:19 PM

## 2016-03-24 LAB — RUBELLA SCREEN: RUBELLA: 12.9 {index} (ref >0.99)

## 2016-03-24 LAB — GC/CHLAMYDIA PROBE AMP (~~LOC~~) NOT AT ARMC
Chlamydia: NEGATIVE
Neisseria Gonorrhea: NEGATIVE

## 2016-03-24 LAB — RPR: RPR Ser Ql: NONREACTIVE

## 2016-03-24 LAB — HIV ANTIBODY (ROUTINE TESTING W REFLEX): HIV Screen 4th Generation wRfx: NONREACTIVE

## 2016-03-26 LAB — CULTURE, OB URINE

## 2016-04-12 ENCOUNTER — Encounter: Payer: Self-pay | Admitting: Advanced Practice Midwife

## 2016-04-12 ENCOUNTER — Ambulatory Visit (INDEPENDENT_AMBULATORY_CARE_PROVIDER_SITE_OTHER): Payer: 59 | Admitting: Advanced Practice Midwife

## 2016-04-12 VITALS — BP 138/77 | HR 96 | Wt 194.0 lb

## 2016-04-12 DIAGNOSIS — O34219 Maternal care for unspecified type scar from previous cesarean delivery: Secondary | ICD-10-CM

## 2016-04-12 DIAGNOSIS — Z23 Encounter for immunization: Secondary | ICD-10-CM | POA: Diagnosis not present

## 2016-04-12 DIAGNOSIS — O0933 Supervision of pregnancy with insufficient antenatal care, third trimester: Secondary | ICD-10-CM

## 2016-04-12 DIAGNOSIS — O09523 Supervision of elderly multigravida, third trimester: Secondary | ICD-10-CM | POA: Diagnosis not present

## 2016-04-12 DIAGNOSIS — O09899 Supervision of other high risk pregnancies, unspecified trimester: Secondary | ICD-10-CM | POA: Diagnosis not present

## 2016-04-12 DIAGNOSIS — Z8679 Personal history of other diseases of the circulatory system: Secondary | ICD-10-CM | POA: Diagnosis not present

## 2016-04-12 LAB — OB RESULTS CONSOLE GBS: STREP GROUP B AG: POSITIVE

## 2016-04-12 NOTE — Progress Notes (Signed)
Subjective:    Vanessa Shannon is being seen today for her first obstetrical visit.  This is not a planned pregnancy. She has not had any prenatal care this pregnancy. Was seen in MAU for RLP 3 weeks ago. OB Panel, CMET, GC/Chlamydia cultures done. She is at 5638w2d gestation by certain, regular LMP. Her obstetrical history is significant for advanced maternal age, pregnancy induced hypertension vs CHTN and previous C/S x 2. Dx CHTN at 39 weeks w/ G2 and had elevated BP for several months afterward, but it returned to normal and has not been on meds since then. Relationship with Vanessa Shannon: spouse, living together. Patient does intend to breast feed. Currently BF 40 year-old. Pregnancy history fully reviewed.  Patient reports no complaints.  Review of Systems:   Review of Systems  Constitutional: Negative for chills and fever.  Eyes: Negative for visual disturbance.  Gastrointestinal: Negative for abdominal pain.  Genitourinary: Negative for vaginal bleeding and vaginal discharge.  Neurological: Negative for headaches.    Objective:     BP 138/77   Pulse 96   Wt 194 lb (88 kg)   LMP 07/26/2015 (Exact Date)   BMI 33.30 kg/m  Physical Exam  Nursing note and vitals reviewed. Constitutional: She is oriented to person, place, and time. She appears well-developed and well-nourished. No distress.  HENT:  Head: Normocephalic.  Eyes: Conjunctivae are normal. No scleral icterus.  Neck: No thyromegaly present.  Cardiovascular: Normal rate and regular rhythm.   Respiratory: Effort normal and breath sounds normal. No respiratory distress.  GI: Soft. She exhibits no distension. There is no tenderness.  Genitourinary: Vagina normal and uterus normal. No vaginal discharge found.  Musculoskeletal: She exhibits no edema or tenderness.  Neurological: She is alert and oriented to person, place, and time. She has normal reflexes.  Skin: Skin is warm and dry.  Psychiatric: She has a normal mood and affect.    Maternal Exam:  Abdomen: Patient reports no abdominal tenderness. Fundal height is 37.   Fetal presentation: vertex  Introitus: Normal vulva. Normal vagina.  Vagina is negative for discharge.    Fetal Exam Fetal Monitor Review: Mode: ultrasound.   Baseline rate: 150's.     Informal BS US: Active baby. Positive FMB, > 2 cm SFP, Vtx  Assessment:    Pregnancy: [id number] 1. Elderly multigravida in third trimester  - US MFM OB DETAIL +14 WK; Future - Protein / creatinine ratio, urine - Glucose Tolerance, 1 HR (50g) - Culture, beta strep (group b only)  2. Limited prenatal care in third trimester  - US MFM OB DETAIL +14 WK; Future - Protein / creatinine ratio, urine - Glucose Tolerance, 1 HR (50g) - Culture, beta strep (group b only) - Tdap vaccine greater than or equal to 7yo IM  3. Previous cesarean delivery affecting pregnancy, antepartum - In-basket message sent to [place] to schedule repeat C/S and BTL at 39 weeks - US MFM OB DETAIL +14 WK; Future - Protein / creatinine ratio, urine - Glucose Tolerance, 1 HR (50g) - Culture, beta strep (group b only)  4. History of chronic hypertension  - US MFM OB DETAIL +14 WK; Future - Protein / creatinine ratio, urine - Glucose Tolerance, 1 HR (50g) - Culture, beta strep (group b only)  5. AMA (advanced maternal age) multigravida 35+, third trimester  - Culture, beta strep (group b only)  6. Supervision of other high risk pregnancy, antepartum  - Culture, beta strep (group b only)  Plan:  Initial labs reviewed from MAU visit. Taking OTC Prenatal vitamins. Problem list reviewed and updated. AFP3 discussed: too late. Declined NIPS.Marland Kitchen. Role of ultrasound in pregnancy discussed; fetal survey: ordered. Amniocentesis discussed: declined. Follow up in 1 weeks. Twice weekly testing for AMA and questionable CHTN.  Baseline PCR. 1 hour GTT   [name] 04/12/2016

## 2016-04-12 NOTE — Patient Instructions (Signed)
What You Need to Know About Female Sterilization Female sterilization is surgery to prevent pregnancy. In this surgery, the fallopian tubes are either blocked or closed off. This prevents eggs from reaching the uterus so that the eggs cannot be fertilized by sperm and you cannot get pregnant. Sterilization is permanent. It should only be done if you are sure that you do not want to be able to have children. What are the sterilization surgery options? There are several kinds of female sterilization surgeries. They include:  Laparoscopic tubal ligation. In this surgery, the fallopian tubes are tied off, sealed with heat, or blocked with a clip, ring, or clamp. A small portion of each fallopian tube may also be removed. This surgery is done through several small cuts (incisions).  Postpartum tubal ligation. This is also called a mini-laparotomy. This surgery is done right after childbirth or 1 or 2 days after childbirth. In this surgery, the fallopian tubes are tied off, sealed with heat, or blocked with a clip, ring, or clamp. A small portion of each fallopian tube may also be removed. The surgery is done through a single incision.  Hysteroscopic sterilization. In this surgery, a tiny, spring-like coil is inserted through the cervix and uterus into the fallopian tubes. The coil causes scarring, which blocks the tubes. After the surgery, contraception should be used for 3 months to allow the scar tissue to form completely. Is sterilization safe? Generally, sterilization is safe. Complications are rare. However, there are risks. They include:  Bleeding.  Infection.  Reaction to medicine used during the procedure.  Injury to surrounding organs.  Failure of the procedure. How effective is sterilization? Sterilization is nearly 100% effective, but it can fail. Also, the fallopian tubes can grow back together over time. If this happens, you will be able to get pregnant again. Women who have had this  procedure have a higher chance of having an ectopic pregnancy. An ectopic pregnancy is a pregnancy that happens outside of the uterus. This kind of pregnancy is unsuccessful and can lead to serious bleeding if it is not treated. What are the benefits?  It is usually effective for a lifetime.  It is usually safe.  It does not have the drawbacks of other types of birth control: That means:  Your hormones are not affected. Because of this, your menstrual periods, sexual desire, and sexual performance will not be affected.  There are no side effects. What are the drawbacks?  If you change your mind and decide that you want to have children, you may not be able to. Sterilization may be reversed, but a reversal is not always successful.  It does not provide protection against STDs (sexually transmitted diseases).  It increases the chance of having an ectopic pregnancy. This information is not intended to replace advice given to you by your health care provider. Make sure you discuss any questions you have with your health care provider. Document Released: 10/20/2007 Document Revised: 12/25/2015 Document Reviewed: 01/28/2015 Elsevier Interactive Patient Education  2017 Elsevier Inc.  

## 2016-04-13 LAB — GLUCOSE TOLERANCE, 1 HOUR (50G) W/O FASTING: Glucose, 1 Hr, gestational: 134 mg/dL (ref <140)

## 2016-04-13 LAB — PROTEIN / CREATININE RATIO, URINE
CREATININE, URINE: 198 mg/dL (ref 20–320)
Protein Creatinine Ratio: 187 mg/g creat — ABNORMAL HIGH (ref 21–161)
TOTAL PROTEIN, URINE: 37 mg/dL — AB (ref 5–24)

## 2016-04-15 ENCOUNTER — Ambulatory Visit (HOSPITAL_COMMUNITY)
Admission: RE | Admit: 2016-04-15 | Discharge: 2016-04-15 | Disposition: A | Discharge status: Home / Self Care | Payer: 59 | Source: Ambulatory Visit | Attending: Advanced Practice Midwife | Admitting: Advanced Practice Midwife

## 2016-04-15 ENCOUNTER — Encounter (HOSPITAL_COMMUNITY): Payer: Self-pay

## 2016-04-15 DIAGNOSIS — Z3A37 37 weeks gestation of pregnancy: Secondary | ICD-10-CM | POA: Insufficient documentation

## 2016-04-15 DIAGNOSIS — Z8679 Personal history of other diseases of the circulatory system: Secondary | ICD-10-CM | POA: Diagnosis not present

## 2016-04-15 DIAGNOSIS — O34219 Maternal care for unspecified type scar from previous cesarean delivery: Secondary | ICD-10-CM | POA: Insufficient documentation

## 2016-04-15 DIAGNOSIS — O09523 Supervision of elderly multigravida, third trimester: Secondary | ICD-10-CM | POA: Insufficient documentation

## 2016-04-15 DIAGNOSIS — O0933 Supervision of pregnancy with insufficient antenatal care, third trimester: Secondary | ICD-10-CM | POA: Insufficient documentation

## 2016-04-15 DIAGNOSIS — Z3689 Encounter for other specified antenatal screening: Secondary | ICD-10-CM | POA: Diagnosis not present

## 2016-04-16 ENCOUNTER — Encounter: Payer: Self-pay | Admitting: Advanced Practice Midwife

## 2016-04-16 DIAGNOSIS — O99820 Streptococcus B carrier state complicating pregnancy: Secondary | ICD-10-CM | POA: Insufficient documentation

## 2016-04-16 LAB — CULTURE, BETA STREP (GROUP B ONLY)

## 2016-04-19 ENCOUNTER — Telehealth (HOSPITAL_COMMUNITY): Payer: Self-pay | Admitting: *Deleted

## 2016-04-19 ENCOUNTER — Ambulatory Visit (INDEPENDENT_AMBULATORY_CARE_PROVIDER_SITE_OTHER): Payer: 59 | Admitting: *Deleted

## 2016-04-19 VITALS — BP 128/74 | HR 83 | Wt 191.0 lb

## 2016-04-19 DIAGNOSIS — Z3492 Encounter for supervision of normal pregnancy, unspecified, second trimester: Secondary | ICD-10-CM

## 2016-04-19 NOTE — Telephone Encounter (Signed)
Preadmission screen  

## 2016-04-20 ENCOUNTER — Encounter (HOSPITAL_COMMUNITY): Payer: Self-pay

## 2016-04-22 ENCOUNTER — Ambulatory Visit (INDEPENDENT_AMBULATORY_CARE_PROVIDER_SITE_OTHER): Payer: 59 | Admitting: Obstetrics & Gynecology

## 2016-04-22 ENCOUNTER — Encounter (HOSPITAL_COMMUNITY)
Admission: RE | Admit: 2016-04-22 | Discharge: 2016-04-22 | Disposition: A | Discharge status: Home / Self Care | Payer: 59 | Source: Ambulatory Visit | Attending: Obstetrics & Gynecology | Admitting: Obstetrics & Gynecology

## 2016-04-22 VITALS — BP 121/70 | HR 87 | Wt 192.0 lb

## 2016-04-22 DIAGNOSIS — Z8759 Personal history of other complications of pregnancy, childbirth and the puerperium: Secondary | ICD-10-CM

## 2016-04-22 DIAGNOSIS — O9952 Diseases of the respiratory system complicating childbirth: Secondary | ICD-10-CM | POA: Diagnosis not present

## 2016-04-22 DIAGNOSIS — O09899 Supervision of other high risk pregnancies, unspecified trimester: Secondary | ICD-10-CM

## 2016-04-22 DIAGNOSIS — O99824 Streptococcus B carrier state complicating childbirth: Secondary | ICD-10-CM | POA: Diagnosis not present

## 2016-04-22 DIAGNOSIS — Z8249 Family history of ischemic heart disease and other diseases of the circulatory system: Secondary | ICD-10-CM | POA: Diagnosis not present

## 2016-04-22 DIAGNOSIS — O99820 Streptococcus B carrier state complicating pregnancy: Secondary | ICD-10-CM

## 2016-04-22 DIAGNOSIS — Z3A39 39 weeks gestation of pregnancy: Secondary | ICD-10-CM | POA: Diagnosis not present

## 2016-04-22 DIAGNOSIS — Z302 Encounter for sterilization: Secondary | ICD-10-CM | POA: Diagnosis not present

## 2016-04-22 DIAGNOSIS — Z823 Family history of stroke: Secondary | ICD-10-CM | POA: Diagnosis not present

## 2016-04-22 DIAGNOSIS — J309 Allergic rhinitis, unspecified: Secondary | ICD-10-CM | POA: Diagnosis not present

## 2016-04-22 DIAGNOSIS — O34219 Maternal care for unspecified type scar from previous cesarean delivery: Secondary | ICD-10-CM

## 2016-04-22 DIAGNOSIS — O09523 Supervision of elderly multigravida, third trimester: Secondary | ICD-10-CM | POA: Diagnosis not present

## 2016-04-22 DIAGNOSIS — O34211 Maternal care for low transverse scar from previous cesarean delivery: Secondary | ICD-10-CM | POA: Diagnosis not present

## 2016-04-22 HISTORY — DX: Encounter for other specified aftercare: Z51.89

## 2016-04-22 LAB — CBC
HCT: 33.2 % — ABNORMAL LOW (ref 36.0–46.0)
HEMOGLOBIN: 11.4 g/dL — AB (ref 12.0–15.0)
MCH: 32.1 pg (ref 26.0–34.0)
MCHC: 34.3 g/dL (ref 30.0–36.0)
MCV: 93.5 fL (ref 78.0–100.0)
PLATELETS: 203 10*3/uL (ref 150–400)
RBC: 3.55 MIL/uL — AB (ref 3.87–5.11)
RDW: 13.2 % (ref 11.5–15.5)
WBC: 7.2 10*3/uL (ref 4.0–10.5)

## 2016-04-22 NOTE — Patient Instructions (Signed)
20 Vanessa Shannon  04/22/2016   Your procedure is scheduled on:  04/24/2016   Enter through [facility] at 0930 AM.     Call this number if you have problems the morning of surgery: [phone]   Remember:   Do not eat food:After Midnight.  Do not drink clear liquids: After Midnight.  Take these medicines the morning of surgery with A SIP OF WATER: none   Do not wear jewelry, make-up or nail polish.  Do not wear lotions, powders, or perfumes. Do not wear deodorant.  Do not shave 48 hours prior to surgery.  Do not bring valuables to the hospital.  [facility] is not   responsible for any belongings or valuables brought to the hospital.  Contacts, dentures or bridgework may not be worn into surgery.  Leave suitcase in the car. After surgery it may be brought to your room.  For patients admitted to the hospital, checkout time is 11:00 AM the day of              discharge.   Patients discharged the day of surgery will not be allowed to drive             home.  Name and phone number of your driver: na  Special Instructions:   N/A   Please read over the following fact sheets that you were given:   Surgical Site Infection Prevention

## 2016-04-22 NOTE — Progress Notes (Signed)
   PRENATAL VISIT NOTE  Subjective:  Vanessa Shannon is a 40 y.o. [id number] at 3653w5d being seen today for ongoing prenatal care.  She is currently monitored for the following issues for this high-risk pregnancy and has Allergic rhinitis; Insufficient prenatal care in third trimester; AMA (advanced maternal age) multigravida 35+, third trimester; Uterine scar from previous cesarean delivery affecting pregnancy; History of gestational hypertension; Supervision of other high risk pregnancy, antepartum; and Group B Streptococcus carrier, antepartum on her problem list.  Patient reports no complaints.   .  .   . Denies leaking of fluid.   The following portions of the patient's history were reviewed and updated as appropriate: allergies, current medications, past family history, past medical history, past social history, past surgical history and problem list. Problem list updated.  Objective:  There were no vitals filed for this visit.  Fetal Status:           General:  Alert, oriented and cooperative. Patient is in no acute distress.  Skin: Skin is warm and dry. No rash noted.   Cardiovascular: Normal heart rate noted  Respiratory: Normal respiratory effort, no problems with respiration noted  Abdomen: Soft, gravid, appropriate for gestational age.       Pelvic:  Cervical exam deferred        Extremities: Normal range of motion.     Mental Status: Normal mood and affect. Normal behavior. Normal judgment and thought content.   Assessment and Plan:  Pregnancy: [id number] at 4253w5d  1. Supervision of other high risk pregnancy, antepartum   2. Uterine scar from previous cesarean delivery affecting pregnancy - RLTCS this Saturday  3. AMA (advanced maternal age) multigravida 35+, third trimester   4. Group B Streptococcus carrier, antepartum  5. History of gestational hypertension   Term labor symptoms and general obstetric precautions including but not limited to vaginal bleeding,  contractions, leaking of fluid and fetal movement were reviewed in detail with the patient. Please refer to After Visit Summary for other counseling recommendations.  No Follow-up on file.   Allie BossierMyra C Xiara Knisley, MD

## 2016-04-23 ENCOUNTER — Encounter (HOSPITAL_COMMUNITY): Admission: RE | Admit: 2016-04-23 | Payer: 59 | Source: Ambulatory Visit

## 2016-04-23 LAB — RPR: RPR: NONREACTIVE

## 2016-04-24 ENCOUNTER — Inpatient Hospital Stay (HOSPITAL_COMMUNITY): Admission: RE | Admit: 2016-04-24 | Payer: 59 | Source: Ambulatory Visit | Admitting: Obstetrics & Gynecology

## 2016-04-24 ENCOUNTER — Inpatient Hospital Stay (HOSPITAL_COMMUNITY): Payer: 59 | Admitting: Anesthesiology

## 2016-04-24 ENCOUNTER — Encounter (HOSPITAL_COMMUNITY)
Admission: AD | Disposition: A | Discharge status: Home / Self Care | Payer: Self-pay | Source: Ambulatory Visit | Attending: Obstetrics & Gynecology

## 2016-04-24 ENCOUNTER — Encounter (HOSPITAL_COMMUNITY): Payer: Self-pay

## 2016-04-24 ENCOUNTER — Inpatient Hospital Stay (HOSPITAL_COMMUNITY)
Admission: AD | Admit: 2016-04-24 | Discharge: 2016-04-26 | DRG: 766 | Disposition: A | Discharge status: Home / Self Care | Payer: 59 | Source: Ambulatory Visit | Attending: Obstetrics & Gynecology | Admitting: Obstetrics & Gynecology

## 2016-04-24 DIAGNOSIS — O99824 Streptococcus B carrier state complicating childbirth: Secondary | ICD-10-CM | POA: Diagnosis present

## 2016-04-24 DIAGNOSIS — Z823 Family history of stroke: Secondary | ICD-10-CM | POA: Diagnosis not present

## 2016-04-24 DIAGNOSIS — Z3A39 39 weeks gestation of pregnancy: Secondary | ICD-10-CM

## 2016-04-24 DIAGNOSIS — O09899 Supervision of other high risk pregnancies, unspecified trimester: Secondary | ICD-10-CM

## 2016-04-24 DIAGNOSIS — O09523 Supervision of elderly multigravida, third trimester: Secondary | ICD-10-CM

## 2016-04-24 DIAGNOSIS — Z9889 Other specified postprocedural states: Secondary | ICD-10-CM

## 2016-04-24 DIAGNOSIS — O9952 Diseases of the respiratory system complicating childbirth: Secondary | ICD-10-CM | POA: Diagnosis present

## 2016-04-24 DIAGNOSIS — Z302 Encounter for sterilization: Secondary | ICD-10-CM | POA: Diagnosis not present

## 2016-04-24 DIAGNOSIS — Z8759 Personal history of other complications of pregnancy, childbirth and the puerperium: Secondary | ICD-10-CM

## 2016-04-24 DIAGNOSIS — J309 Allergic rhinitis, unspecified: Secondary | ICD-10-CM | POA: Diagnosis present

## 2016-04-24 DIAGNOSIS — O34211 Maternal care for low transverse scar from previous cesarean delivery: Principal | ICD-10-CM | POA: Diagnosis present

## 2016-04-24 DIAGNOSIS — Z8249 Family history of ischemic heart disease and other diseases of the circulatory system: Secondary | ICD-10-CM

## 2016-04-24 DIAGNOSIS — O99820 Streptococcus B carrier state complicating pregnancy: Secondary | ICD-10-CM

## 2016-04-24 DIAGNOSIS — N858 Other specified noninflammatory disorders of uterus: Secondary | ICD-10-CM | POA: Diagnosis not present

## 2016-04-24 DIAGNOSIS — O34219 Maternal care for unspecified type scar from previous cesarean delivery: Secondary | ICD-10-CM | POA: Diagnosis not present

## 2016-04-24 DIAGNOSIS — Z98891 History of uterine scar from previous surgery: Secondary | ICD-10-CM | POA: Diagnosis present

## 2016-04-24 DIAGNOSIS — O0933 Supervision of pregnancy with insufficient antenatal care, third trimester: Secondary | ICD-10-CM

## 2016-04-24 LAB — PREPARE RBC (CROSSMATCH)

## 2016-04-24 SURGERY — Surgical Case
Anesthesia: Regional

## 2016-04-24 SURGERY — Surgical Case
Anesthesia: Spinal | Laterality: Bilateral | Wound class: Clean Contaminated

## 2016-04-24 MED ORDER — DEXAMETHASONE SODIUM PHOSPHATE 10 MG/ML IJ SOLN
INTRAMUSCULAR | Status: DC | PRN
  Administered 2016-04-24: 10 mg via INTRAVENOUS

## 2016-04-24 MED ORDER — CEFAZOLIN SODIUM-DEXTROSE 2-4 GM/100ML-% IV SOLN
2.0000 g | INTRAVENOUS | Status: AC
  Administered 2016-04-24: 2 g via INTRAVENOUS
  Filled 2016-04-24: qty 100

## 2016-04-24 MED ORDER — KETOROLAC TROMETHAMINE 30 MG/ML IJ SOLN: 30.0000 mg | Freq: Four times a day (QID) | INTRAMUSCULAR | Status: DC | PRN

## 2016-04-24 MED ORDER — OXYTOCIN 40 UNITS IN LACTATED RINGERS INFUSION - SIMPLE MED: 2.5000 [IU]/h | INTRAVENOUS | Status: AC

## 2016-04-24 MED ORDER — LACTATED RINGERS IV SOLN
INTRAVENOUS | Status: DC
  Administered 2016-04-25: via INTRAVENOUS

## 2016-04-24 MED ORDER — TETANUS-DIPHTH-ACELL PERTUSSIS 5-2.5-18.5 LF-MCG/0.5 IM SUSP: 0.5000 mL | Freq: Once | INTRAMUSCULAR | Status: DC

## 2016-04-24 MED ORDER — BUPIVACAINE HCL (PF) 0.5 % IJ SOLN
INTRAMUSCULAR | Status: AC
  Filled 2016-04-24: qty 30

## 2016-04-24 MED ORDER — PHENYLEPHRINE 8 MG IN D5W 100 ML (0.08MG/ML) PREMIX OPTIME
INJECTION | INTRAVENOUS | Status: DC | PRN
  Administered 2016-04-24: 60 ug/min via INTRAVENOUS

## 2016-04-24 MED ORDER — KETOROLAC TROMETHAMINE 30 MG/ML IJ SOLN: 30.0000 mg | Freq: Once | INTRAMUSCULAR | Status: DC

## 2016-04-24 MED ORDER — FENTANYL CITRATE (PF) 100 MCG/2ML IJ SOLN
INTRAMUSCULAR | Status: AC
  Filled 2016-04-24: qty 2

## 2016-04-24 MED ORDER — ONDANSETRON HCL 4 MG/2ML IJ SOLN
INTRAMUSCULAR | Status: AC
  Filled 2016-04-24: qty 2

## 2016-04-24 MED ORDER — BUPIVACAINE IN DEXTROSE 0.75-8.25 % IT SOLN
INTRATHECAL | Status: DC | PRN
  Administered 2016-04-24: 1.4 mL via INTRATHECAL

## 2016-04-24 MED ORDER — MENTHOL 3 MG MT LOZG
1.0000 | LOZENGE | OROMUCOSAL | Status: DC | PRN
Start: 2016-04-24 — End: 2016-04-26

## 2016-04-24 MED ORDER — KETOROLAC TROMETHAMINE 30 MG/ML IJ SOLN
INTRAMUSCULAR | Status: AC
  Administered 2016-04-24: 30 mg via INTRAMUSCULAR
  Filled 2016-04-24: qty 1

## 2016-04-24 MED ORDER — SENNOSIDES-DOCUSATE SODIUM 8.6-50 MG PO TABS
2.0000 | ORAL_TABLET | ORAL | Status: DC
  Administered 2016-04-25: 2 via ORAL
  Filled 2016-04-24: qty 2

## 2016-04-24 MED ORDER — SIMETHICONE 80 MG PO CHEW
80.0000 mg | CHEWABLE_TABLET | Freq: Three times a day (TID) | ORAL | Status: DC
  Administered 2016-04-25 – 2016-04-26 (×4): 80 mg via ORAL
  Filled 2016-04-24 (×4): qty 1

## 2016-04-24 MED ORDER — SOD CITRATE-CITRIC ACID 500-334 MG/5ML PO SOLN
30.0000 mL | ORAL | Status: AC
  Administered 2016-04-24: 30 mL via ORAL
  Filled 2016-04-24: qty 15

## 2016-04-24 MED ORDER — MEASLES, MUMPS & RUBELLA VAC ~~LOC~~ INJ
0.5000 mL | INJECTION | Freq: Once | SUBCUTANEOUS | Status: DC
  Filled 2016-04-24: qty 0.5

## 2016-04-24 MED ORDER — SODIUM CHLORIDE 0.9 % IR SOLN
Status: DC | PRN
  Administered 2016-04-24: 1000 mL

## 2016-04-24 MED ORDER — ONDANSETRON HCL 4 MG/2ML IJ SOLN
INTRAMUSCULAR | Status: DC | PRN
  Administered 2016-04-24: 4 mg via INTRAVENOUS

## 2016-04-24 MED ORDER — KETOROLAC TROMETHAMINE 30 MG/ML IJ SOLN
30.0000 mg | Freq: Four times a day (QID) | INTRAMUSCULAR | Status: DC | PRN
  Administered 2016-04-24: 30 mg via INTRAMUSCULAR

## 2016-04-24 MED ORDER — DIBUCAINE 1 % RE OINT: 1.0000 "application " | TOPICAL_OINTMENT | RECTAL | Status: DC | PRN

## 2016-04-24 MED ORDER — MORPHINE SULFATE-NACL 0.5-0.9 MG/ML-% IV SOSY
PREFILLED_SYRINGE | INTRAVENOUS | Status: AC
  Filled 2016-04-24: qty 1

## 2016-04-24 MED ORDER — HYDROMORPHONE HCL 1 MG/ML IJ SOLN: 0.2500 mg | INTRAMUSCULAR | Status: DC | PRN

## 2016-04-24 MED ORDER — SIMETHICONE 80 MG PO CHEW
80.0000 mg | CHEWABLE_TABLET | ORAL | Status: DC
  Administered 2016-04-25: 80 mg via ORAL
  Filled 2016-04-24: qty 1

## 2016-04-24 MED ORDER — OXYTOCIN 10 UNIT/ML IJ SOLN
INTRAVENOUS | Status: DC | PRN
  Administered 2016-04-24: 40 [IU] via INTRAVENOUS

## 2016-04-24 MED ORDER — MEPERIDINE HCL 25 MG/ML IJ SOLN: 6.2500 mg | INTRAMUSCULAR | Status: DC | PRN

## 2016-04-24 MED ORDER — BUPIVACAINE HCL (PF) 0.5 % IJ SOLN
INTRAMUSCULAR | Status: DC | PRN
  Administered 2016-04-24: 30 mL

## 2016-04-24 MED ORDER — PRENATAL MULTIVITAMIN CH: 1.0000 | ORAL_TABLET | Freq: Every day | ORAL | Status: DC

## 2016-04-24 MED ORDER — NALOXONE HCL 2 MG/2ML IJ SOSY
1.0000 ug/kg/h | PREFILLED_SYRINGE | INTRAVENOUS | Status: DC | PRN
  Filled 2016-04-24: qty 2

## 2016-04-24 MED ORDER — SIMETHICONE 80 MG PO CHEW
80.0000 mg | CHEWABLE_TABLET | ORAL | Status: DC | PRN
  Administered 2016-04-25: 80 mg via ORAL
  Filled 2016-04-24: qty 1

## 2016-04-24 MED ORDER — DIPHENHYDRAMINE HCL 25 MG PO CAPS
25.0000 mg | ORAL_CAPSULE | ORAL | Status: DC | PRN
  Filled 2016-04-24: qty 1

## 2016-04-24 MED ORDER — NALBUPHINE HCL 10 MG/ML IJ SOLN: 5.0000 mg | INTRAMUSCULAR | Status: DC | PRN

## 2016-04-24 MED ORDER — WITCH HAZEL-GLYCERIN EX PADS: 1.0000 "application " | MEDICATED_PAD | CUTANEOUS | Status: DC | PRN

## 2016-04-24 MED ORDER — IBUPROFEN 600 MG PO TABS: 600.0000 mg | ORAL_TABLET | Freq: Four times a day (QID) | ORAL | Status: DC | PRN

## 2016-04-24 MED ORDER — LACTATED RINGERS IV SOLN
INTRAVENOUS | Status: DC | PRN
  Administered 2016-04-24: 13:00:00 via INTRAVENOUS

## 2016-04-24 MED ORDER — SCOPOLAMINE 1 MG/3DAYS TD PT72: 1.0000 | MEDICATED_PATCH | Freq: Once | TRANSDERMAL | Status: DC

## 2016-04-24 MED ORDER — OXYTOCIN 10 UNIT/ML IJ SOLN
INTRAMUSCULAR | Status: AC
  Filled 2016-04-24: qty 4

## 2016-04-24 MED ORDER — SODIUM CHLORIDE 0.9% FLUSH: 3.0000 mL | INTRAVENOUS | Status: DC | PRN

## 2016-04-24 MED ORDER — DIPHENHYDRAMINE HCL 50 MG/ML IJ SOLN: 12.5000 mg | INTRAMUSCULAR | Status: DC | PRN

## 2016-04-24 MED ORDER — OXYCODONE-ACETAMINOPHEN 5-325 MG PO TABS
1.0000 | ORAL_TABLET | ORAL | Status: DC | PRN
  Administered 2016-04-25 – 2016-04-26 (×3): 1 via ORAL
  Filled 2016-04-24 (×3): qty 1

## 2016-04-24 MED ORDER — SCOPOLAMINE 1 MG/3DAYS TD PT72
MEDICATED_PATCH | TRANSDERMAL | Status: DC | PRN
  Administered 2016-04-24: 1 via TRANSDERMAL

## 2016-04-24 MED ORDER — ONDANSETRON HCL 4 MG/2ML IJ SOLN: 4.0000 mg | Freq: Three times a day (TID) | INTRAMUSCULAR | Status: DC | PRN

## 2016-04-24 MED ORDER — ACETAMINOPHEN 500 MG PO TABS: 1000.0000 mg | ORAL_TABLET | Freq: Four times a day (QID) | ORAL | Status: DC

## 2016-04-24 MED ORDER — IBUPROFEN 600 MG PO TABS
600.0000 mg | ORAL_TABLET | Freq: Four times a day (QID) | ORAL | Status: DC
  Administered 2016-04-24 – 2016-04-26 (×7): 600 mg via ORAL
  Filled 2016-04-24 (×7): qty 1

## 2016-04-24 MED ORDER — ZOLPIDEM TARTRATE 5 MG PO TABS: 5.0000 mg | ORAL_TABLET | Freq: Every evening | ORAL | Status: DC | PRN

## 2016-04-24 MED ORDER — MORPHINE SULFATE (PF) 0.5 MG/ML IJ SOLN
INTRAMUSCULAR | Status: DC | PRN
Start: 2016-04-24 — End: 2016-04-24
  Administered 2016-04-24: .2 mg via INTRATHECAL

## 2016-04-24 MED ORDER — FAMOTIDINE IN NACL 20-0.9 MG/50ML-% IV SOLN
20.0000 mg | Freq: Once | INTRAVENOUS | Status: AC
  Administered 2016-04-24: 20 mg via INTRAVENOUS

## 2016-04-24 MED ORDER — COCONUT OIL OIL: 1.0000 "application " | TOPICAL_OIL | Status: DC | PRN

## 2016-04-24 MED ORDER — PHENYLEPHRINE 8 MG IN D5W 100 ML (0.08MG/ML) PREMIX OPTIME
INJECTION | INTRAVENOUS | Status: AC
  Filled 2016-04-24: qty 100

## 2016-04-24 MED ORDER — OXYCODONE-ACETAMINOPHEN 5-325 MG PO TABS: 2.0000 | ORAL_TABLET | ORAL | Status: DC | PRN

## 2016-04-24 MED ORDER — NALBUPHINE HCL 10 MG/ML IJ SOLN: 5.0000 mg | Freq: Once | INTRAMUSCULAR | Status: DC | PRN

## 2016-04-24 MED ORDER — ACETAMINOPHEN 325 MG PO TABS
650.0000 mg | ORAL_TABLET | ORAL | Status: DC | PRN
  Administered 2016-04-25 – 2016-04-26 (×3): 650 mg via ORAL
  Filled 2016-04-24 (×3): qty 2

## 2016-04-24 MED ORDER — PROMETHAZINE HCL 25 MG/ML IJ SOLN: 6.2500 mg | INTRAMUSCULAR | Status: DC | PRN

## 2016-04-24 MED ORDER — LACTATED RINGERS IV SOLN
INTRAVENOUS | Status: DC
  Administered 2016-04-24 (×2): via INTRAVENOUS

## 2016-04-24 MED ORDER — PRENATAL MULTIVITAMIN CH
1.0000 | ORAL_TABLET | Freq: Every day | ORAL | Status: DC
  Administered 2016-04-25 – 2016-04-26 (×2): 1 via ORAL
  Filled 2016-04-24 (×2): qty 1

## 2016-04-24 MED ORDER — DEXAMETHASONE SODIUM PHOSPHATE 10 MG/ML IJ SOLN
INTRAMUSCULAR | Status: AC
  Filled 2016-04-24: qty 1

## 2016-04-24 MED ORDER — SCOPOLAMINE 1 MG/3DAYS TD PT72
MEDICATED_PATCH | TRANSDERMAL | Status: AC
  Filled 2016-04-24: qty 1

## 2016-04-24 MED ORDER — ONDANSETRON HCL 4 MG/2ML IJ SOLN
4.0000 mg | Freq: Four times a day (QID) | INTRAMUSCULAR | Status: DC | PRN
  Administered 2016-04-24: 4 mg via INTRAVENOUS
  Filled 2016-04-24: qty 2

## 2016-04-24 MED ORDER — FAMOTIDINE IN NACL 20-0.9 MG/50ML-% IV SOLN
INTRAVENOUS | Status: AC
  Administered 2016-04-24: 20 mg via INTRAVENOUS
  Filled 2016-04-24: qty 50

## 2016-04-24 MED ORDER — DIPHENHYDRAMINE HCL 25 MG PO CAPS
25.0000 mg | ORAL_CAPSULE | Freq: Four times a day (QID) | ORAL | Status: DC | PRN
  Administered 2016-04-25: 25 mg via ORAL
  Filled 2016-04-24: qty 1

## 2016-04-24 MED ORDER — FENTANYL CITRATE (PF) 100 MCG/2ML IJ SOLN
INTRAMUSCULAR | Status: DC | PRN
  Administered 2016-04-24: 20 ug via INTRATHECAL

## 2016-04-24 MED ORDER — NALOXONE HCL 0.4 MG/ML IJ SOLN: 0.4000 mg | INTRAMUSCULAR | Status: DC | PRN

## 2016-04-24 SURGICAL SUPPLY — 40 items
BENZOIN TINCTURE PRP APPL 2/3 (GAUZE/BANDAGES/DRESSINGS) ×2 IMPLANT
BINDER ABD UNIV 10 28-50 (GAUZE/BANDAGES/DRESSINGS) IMPLANT
BINDER ABD UNIV 12 45-62 (WOUND CARE) IMPLANT
BINDER ABDOM UNIV 10 (GAUZE/BANDAGES/DRESSINGS)
BINDER ABDOMINAL 46IN 62IN (WOUND CARE)
CLAMP CORD UMBIL (MISCELLANEOUS) IMPLANT
CLIP FILSHIE TUBAL LIGA STRL (Clip) ×4 IMPLANT
CLOSURE STERI STRIP 1/2 X4 (GAUZE/BANDAGES/DRESSINGS) ×2 IMPLANT
CLOTH BEACON ORANGE TIMEOUT ST (SAFETY) ×2 IMPLANT
DERMABOND ADVANCED (GAUZE/BANDAGES/DRESSINGS) ×1
DERMABOND ADVANCED .7 DNX12 (GAUZE/BANDAGES/DRESSINGS) ×1 IMPLANT
DRSG OPSITE POSTOP 4X10 (GAUZE/BANDAGES/DRESSINGS) ×2 IMPLANT
DURAPREP 26ML APPLICATOR (WOUND CARE) ×2 IMPLANT
ELECT REM PT RETURN 9FT ADLT (ELECTROSURGICAL) ×2
ELECTRODE REM PT RTRN 9FT ADLT (ELECTROSURGICAL) ×1 IMPLANT
EXTRACTOR VACUUM KIWI (MISCELLANEOUS) IMPLANT
GLOVE BIO SURGEON STRL SZ7 (GLOVE) ×2 IMPLANT
GLOVE BIOGEL PI IND STRL 7.0 (GLOVE) ×2 IMPLANT
GLOVE BIOGEL PI INDICATOR 7.0 (GLOVE) ×2
GOWN STRL REUS W/TWL LRG LVL3 (GOWN DISPOSABLE) ×4 IMPLANT
GOWN STRL REUS W/TWL XL LVL3 (GOWN DISPOSABLE) ×2 IMPLANT
KIT ABG SYR 3ML LUER SLIP (SYRINGE) IMPLANT
NEEDLE HYPO 22GX1.5 SAFETY (NEEDLE) ×2 IMPLANT
NEEDLE HYPO 25X5/8 SAFETYGLIDE (NEEDLE) IMPLANT
NS IRRIG 1000ML POUR BTL (IV SOLUTION) ×2 IMPLANT
PACK C SECTION WH (CUSTOM PROCEDURE TRAY) ×2 IMPLANT
PAD OB MATERNITY 4.3X12.25 (PERSONAL CARE ITEMS) ×2 IMPLANT
PENCIL SMOKE EVAC W/HOLSTER (ELECTROSURGICAL) ×2 IMPLANT
RTRCTR C-SECT PINK 25CM LRG (MISCELLANEOUS) IMPLANT
SPONGE SURGIFOAM ABS GEL 12-7 (HEMOSTASIS) IMPLANT
SUT PDS AB 0 CTX 60 (SUTURE) ×2 IMPLANT
SUT PLAIN 0 NONE (SUTURE) IMPLANT
SUT SILK 0 TIES 10X30 (SUTURE) IMPLANT
SUT VIC AB 0 CT1 36 (SUTURE) ×10 IMPLANT
SUT VIC AB 3-0 CT1 27 (SUTURE) ×1
SUT VIC AB 3-0 CT1 TAPERPNT 27 (SUTURE) ×1 IMPLANT
SUT VIC AB 4-0 KS 27 (SUTURE) ×2 IMPLANT
SYR CONTROL 10ML LL (SYRINGE) ×2 IMPLANT
TOWEL OR 17X24 6PK STRL BLUE (TOWEL DISPOSABLE) ×2 IMPLANT
TRAY FOLEY CATH SILVER 14FR (SET/KITS/TRAYS/PACK) ×2 IMPLANT

## 2016-04-24 NOTE — Brief Op Note (Signed)
04/24/2016  2:23 PM  PATIENT:  Vanessa Shannon  40 y.o. female  PRE-OPERATIVE DIAGNOSIS:  PREV CSESAREAN SECTION AND DESIRE  STERILIZATION  POST-OPERATIVE DIAGNOSIS:  PREV CSESAREAN SECTION AND DESIRE STERILIZATION  PROCEDURE:  Procedure(s): CESAREAN SECTION WITH BILATERAL TUBAL LIGATION (Bilateral)  SURGEON:  Surgeon(s) and Role:    * Willodean Rosenthalarolyn Harraway-Smith, MD - Primary  ANESTHESIA:   spinal  EBL:  Total I/O In: 3200 [I.V.:3200] Out: 700 [Urine:100; Blood:600]  BLOOD ADMINISTERED:none  DRAINS: none   LOCAL MEDICATIONS USED:  MARCAINE     SPECIMEN:  Source of Specimen:  placenta  DISPOSITION OF SPECIMEN:  Labor and delivery  COUNTS:  YES  TOURNIQUET:  * No tourniquets in log *  DICTATION: .Note written in EPIC  PLAN OF CARE: Admit to inpatient   PATIENT DISPOSITION:  PACU - hemodynamically stable.   Delay start of Pharmacological VTE agent (>24hrs) due to surgical blood loss or risk of bleeding: yes  Complications: none immediate  Adaleen Hulgan L. Harraway-Smith, M.D., Evern CoreFACOG

## 2016-04-24 NOTE — Anesthesia Postprocedure Evaluation (Signed)
Anesthesia Post Note  Patient: Vanessa Shannon  Procedure(s) Performed: Procedure(s) (LRB): CESAREAN SECTION WITH BILATERAL TUBAL LIGATION (Bilateral)  Patient location during evaluation: PACU Anesthesia Type: Spinal Level of consciousness: awake Pain management: pain level controlled Vital Signs Assessment: post-procedure vital signs reviewed and stable Respiratory status: spontaneous breathing Cardiovascular status: stable Postop Assessment: no headache, no backache, spinal receding, no signs of nausea or vomiting and patient able to bend at knees Anesthetic complications: no     Last Vitals:  Vitals:   04/24/16 1500 04/24/16 1515  BP: 120/71 117/70  Pulse: 81 73  Resp: (!) 21 19  Temp:      Last Pain:  Vitals:   04/24/16 1515  TempSrc:   PainSc: 0-No pain   Pain Goal:                 Muranda Coye JR,JOHN Martha Soltys

## 2016-04-24 NOTE — Anesthesia Preprocedure Evaluation (Signed)
Anesthesia Evaluation  Patient identified by MRN, date of birth, ID band Patient awake    Reviewed: Allergy & Precautions, H&P , Patient's Chart, lab work & pertinent test results  Airway Mallampati: II  TM Distance: >3 FB Neck ROM: full    Dental no notable dental hx.    Pulmonary neg pulmonary ROS,    Pulmonary exam normal        Cardiovascular negative cardio ROS Normal cardiovascular exam     Neuro/Psych negative psych ROS   GI/Hepatic Neg liver ROS,   Endo/Other  negative endocrine ROS  Renal/GU negative Renal ROS     Musculoskeletal   Abdominal (+) + obese,   Peds  Hematology   Anesthesia Other Findings   Reproductive/Obstetrics (+) Pregnancy                             Anesthesia Physical Anesthesia Plan  ASA: II  Anesthesia Plan: Spinal   Post-op Pain Management:    Induction:   Airway Management Planned:   Additional Equipment:   Intra-op Plan:   Post-operative Plan:   Informed Consent: I have reviewed the patients History and Physical, chart, labs and discussed the procedure including the risks, benefits and alternatives for the proposed anesthesia with the patient or authorized representative who has indicated his/her understanding and acceptance.     Plan Discussed with:   Anesthesia Plan Comments:         Anesthesia Quick Evaluation

## 2016-04-24 NOTE — Transfer of Care (Signed)
Immediate Anesthesia Transfer of Care Note  Patient: Vanessa Shannon  Procedure(s) Performed: Procedure(s): CESAREAN SECTION WITH BILATERAL TUBAL LIGATION (Bilateral)  Patient Location: PACU  Anesthesia Type:Spinal  Level of Consciousness: awake, alert  and oriented  Airway & Oxygen Therapy: Patient Spontanous Breathing  Post-op Assessment: Report given to RN and Post -op Vital signs reviewed and stable  Post vital signs: Reviewed and stable  Last Vitals:  Vitals:   04/24/16 0900  BP: 121/73  Pulse: 87  Temp: 37 C    Last Pain:  Vitals:   04/24/16 1016  TempSrc:   PainSc: 0-No pain         Complications: No apparent anesthesia complications

## 2016-04-24 NOTE — Op Note (Signed)
04/24/2016  2:23 PM  PATIENT:  Vanessa Shannon  40 y.o. female  PRE-OPERATIVE DIAGNOSIS:  PREV CSESAREAN SECTION AND DESIRE  STERILIZATION  POST-OPERATIVE DIAGNOSIS:  PREV CSESAREAN SECTION AND DESIRE STERILIZATION  PROCEDURE:  Procedure(s): CESAREAN SECTION WITH BILATERAL TUBAL LIGATION (Bilateral)  SURGEON:  Surgeon(s) and Role:    * Willodean Rosenthalarolyn Harraway-Smith, MD - Primary  ANESTHESIA:   spinal  EBL:  Total I/O In: 3200 [I.V.:3200] Out: 700 [Urine:100; Blood:600]  BLOOD ADMINISTERED:none  DRAINS: none   LOCAL MEDICATIONS USED:  MARCAINE     SPECIMEN:  Source of Specimen:  placenta  DISPOSITION OF SPECIMEN:  Labor and delivery  COUNTS:  YES  TOURNIQUET:  * No tourniquets in log *  DICTATION: .Note written in EPIC  PLAN OF CARE: Admit to inpatient   PATIENT DISPOSITION:  PACU - hemodynamically stable.   Delay start of Pharmacological VTE agent (>24hrs) due to surgical blood loss or risk of bleeding: yes  Complications: none immediate  INDICATIONS: Vanessa Shannon is a 40 y.o. [id number] at 6467w0d here for cesarean section with bilateral tubal ligation  secondary to the indications listed under preoperative diagnosis; please see preoperative note for further details.  The risks of cesarean section were discussed with the patient including but were not limited to: bleeding which may require transfusion or reoperation; infection which may require antibiotics; injury to bowel, bladder, ureters or other surrounding organs; injury to the fetus; need for additional procedures including hysterectomy in the event of a life-threatening hemorrhage; placental abnormalities wth subsequent pregnancies, incisional problems, thromboembolic phenomenon and other postoperative/anesthesia complications.   I also discussed with pt and her partner the failure risk of BTL of 3-08/998 and the increased risk of ectopic pregnancy should a pregnancy occur. The patient concurred with the proposed plan, giving  informed written consent for the procedure.    FINDINGS:  Viable female infant in cephalic presentation.  Apgars 9 and 10.  Clear amniotic fluid.  Intact placenta, three vessel cord.  Normal uterus, fallopian tubes and ovaries bilaterally.  PROCEDURE IN DETAIL:  The patient preoperatively received intravenous antibiotics and had sequential compression devices applied to her lower extremities.  She was then taken to the operating room where spinal anesthesia was administered and was found to be adequate. She was then placed in a dorsal supine position with a leftward tilt, and prepped and draped in a sterile manner.  A foley catheter was placed into her bladder and attached to constant gravity.  After an adequate timeout was performed, a Pfannenstiel skin incision was made with scalpel and carried through to the underlying layer of fascia. The fascia was incised in the midline, and this incision was extended bilaterally using the Mayo scissors.  Kocher clamps were applied to the superior aspect of the fascial incision and the underlying rectus muscles were dissected off bluntly. A similar process was carried out on the inferior aspect of the fascial incision. The rectus muscles were separated in the midline bluntly and the peritoneum was entered bluntly.  Attention was turned to the lower uterine segment where a low transverse hysterotomy incision was made with a scalpel and extended bilaterally bluntly.  Of  Note there was a uterine window that was paper thin upon entry. The infant was successfully delivered, the cord was clamped and cut and the infant was handed over to awaiting neonatology team. The placenta was delivered manually. Uterine massage was then administered.  The placenta was intact with a three-vessel cord. The uterus was then  cleared of clot and debris.  The hysterotomy was closed with 0 Vicryl in a running locked fashion, and an imbricating layer was also placed with the same suture. The uterus  was returned to the pelvis. The pelvis was cleared of all clot and debris. Hemostasis was confirmed on all surfaces.  The peritoneum and the muscles were reapproximated using 0 Vicryl with 1 interrupted suture. The fascia was then closed using 0 looped PDS.  The subcutaneous layer was irrigated. The skin was closed with a 4-0 Vicryl subcuticular stitch.  30 cc of 0.5% marcaine was injected into the incision and benzoin and steristrips were applied.  The patient tolerated the procedure well. Sponge, lap, instrument and needle counts were correct x 2.  She was taken to the recovery room in stable condition.   Vanessa Shannon, M.D., Evern CoreFACOG

## 2016-04-24 NOTE — Anesthesia Procedure Notes (Signed)
Spinal  Patient location during procedure: OR Start time: 04/24/2016 12:50 PM End time: 04/24/2016 12:53 PM Staffing Anesthesiologist: Leilani AbleHATCHETT, Kelse Ploch Performed: anesthesiologist  Preanesthetic Checklist Completed: patient identified, surgical consent, pre-op evaluation, timeout performed, IV checked, risks and benefits discussed and monitors and equipment checked Spinal Block Patient position: sitting Prep: site prepped and draped and DuraPrep Patient monitoring: heart rate, cardiac monitor, continuous pulse ox and blood pressure Approach: midline Location: L3-4 Injection technique: single-shot Needle Needle type: Pencan  Needle gauge: 24 G Needle length: 9 cm Needle insertion depth: 6 cm Assessment Sensory level: T4

## 2016-04-24 NOTE — H&P (Signed)
Obstetric Preoperative History and Physical  Vanessa Shannon is a 40 y.o. [id number] with IUP at 345w0d presenting for presenting for scheduled repeat cesarean section with bilateral tubal ligation.  No acute concerns.   Prenatal Course Source of Care: [facility]  with onset of care at 37weeks Pregnancy complications or risks: Patient Active Problem List   Diagnosis Date Noted  . Group B Streptococcus carrier, antepartum 04/16/2016  . Supervision of other high risk pregnancy, antepartum 04/12/2016  . Insufficient prenatal care in third trimester 03/23/2016  . AMA (advanced maternal age) multigravida 35+, third trimester 03/23/2016  . Uterine scar from previous cesarean delivery affecting pregnancy 03/23/2016  . History of gestational hypertension 03/23/2016  . Allergic rhinitis 08/09/2013   She plans to breastfeed She desires bilateral tubal ligation for postpartum contraception.   Prenatal labs and studies: ABO, Rh: --/--/O POS (12/07 0935) Antibody: NEG (12/07 0935) Rubella: 12.90 (11/07 1911) RPR: Non Reactive (12/07 0935)  HBsAg: Negative (11/07 1911)  HIV: Non Reactive (11/07 1911)  AVW:UJWJXBJYGBS:Positive (11/27 0000) 1 hr Glucola  WNL Genetic screening not done Anatomy US normal  Prenatal Transfer Tool  Maternal Diabetes: No Genetic Screening: Declined Maternal Ultrasounds/Referrals: Normal Fetal Ultrasounds or other Referrals:  None Maternal Substance Abuse:  No Significant Maternal Medications:  None Significant Maternal Lab Results: None  Past Medical History:  Diagnosis Date  . Anemia 2002   postop hemorrhage  . Blood transfusion without reported diagnosis    uterine laceration with first CS post cesarean section  . GERD (gastroesophageal reflux disease)   . Headache   . Heartburn during pregnancy     Past Surgical History:  Procedure Laterality Date  . CESAREAN SECTION    . CESAREAN SECTION N/A 11/19/2014   Procedure: CESAREAN SECTION;  Surgeon: Lavina Hammanodd Meisinger, MD;  Location:  [facility];  Service: Obstetrics;  Laterality: N/A;    OB History  Gravida Para Term Preterm AB Living  5 2 2   2 2   SAB TAB Ectopic Multiple Live Births  1     0 2    # Outcome Date GA Lbr Len/2nd Weight Sex Delivery Anes PTL Lv  5 Current           4 Term 11/19/14 2945w0d  6 lb 8.2 oz (2.954 kg) F CS-LTranv Spinal  LIV  3 Term 08/16/00     CS-LTranv     2 AB           1 SAB               Social History   Social History  . Marital status: Single    Spouse name: N/A  . Number of children: N/A  . Years of education: N/A   Social History Main Topics  . Smoking status: Never Smoker  . Smokeless tobacco: Never Used  . Alcohol use Yes     Comment: occ/social   . Drug use: No  . Sexual activity: Yes   Other Topics Concern  . 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 Lupus Mother   .  Hypertension Mother   . Cancer Father     brain cancer  . Stroke Maternal Grandmother   . Hypertension Maternal Grandmother   . Stroke Paternal Grandfather     Prescriptions Prior to Admission  Medication Sig Dispense Refill Last Dose  . acetaminophen (TYLENOL) 500 MG tablet Take 500 mg by mouth every 6 (six) hours as needed.   Past Month at Unknown time  . diphenhydrAMINE (BENADRYL) 25 MG tablet Take 25 mg by mouth every 6 (six) hours as needed for allergies.   Past Month at Unknown time  . Prenatal Vit-Fe Fumarate-FA (PRENATAL MULTIVITAMIN) TABS tablet Take 1 tablet by mouth daily at 12 noon.    Past Week at Unknown time    No Known Allergies  Review of Systems: Negative except for what is mentioned in HPI.  Physical Exam: BP 121/73   Pulse 87   Temp 98.6 F (37 C) (Oral)   LMP  07/26/2015 (Exact Date)   SpO2 98%  FHR by Doppler: 140's bpm GENERAL: Well-developed, well-nourished female in no acute distress.  LUNGS: Clear to auscultation bilaterally.  HEART: Regular rate and rhythm. ABDOMEN: Soft, nontender, nondistended, gravid, transverse well-healed Pfannenstiel incision. PELVIC: Deferred EXTREMITIES: Nontender, no edema, 2+ distal pulses.   Pertinent Labs/Studies:   Results for orders placed or performed during the hospital encounter of 04/22/16 (from the past 72 hour(s))  CBC     Status: Abnormal   Collection Time: 04/22/16  9:35 AM  Result Value Ref Range   WBC 7.2 4.0 - 10.5 K/uL   RBC 3.55 (L) 3.87 - 5.11 MIL/uL   Hemoglobin 11.4 (L) 12.0 - 15.0 g/dL   HCT 16.133.2 (L) 09.636.0 - 04.546.0 %   MCV 93.5 78.0 - 100.0 fL   MCH 32.1 26.0 - 34.0 pg   MCHC 34.3 30.0 - 36.0 g/dL   RDW 40.913.2 81.111.5 - 91.415.5 %   Platelets 203 150 - 400 K/uL  RPR     Status: None   Collection Time: 04/22/16  9:35 AM  Result Value Ref Range   RPR Ser Ql Non Reactive Non Reactive    Comment: (NOTE) Performed At: [facility] [address] [place], [place] [id number] Mila HomerHancock William F MD YQ:[id number]:[phone]   Type and screen     Status: None   Collection Time: 04/22/16  9:35 AM  Result Value Ref Range   ABO/RH(D) O POS    Antibody Screen NEG    Sample Expiration 04/25/2016     Assessment and Plan :Vanessa Shannon is a 40 y.o. [id number] at 6975w0d being admitted being admitted for scheduled cesarean section with bilateral tubal ligation. The risks of cesarean section discussed with the patient included but were not limited to: bleeding which may require transfusion or reoperation; infection which may require antibiotics; injury to bowel, bladder, ureters or other surrounding organs; injury to the fetus; need for additional procedures including hysterectomy in the event of a life-threatening hemorrhage; placental abnormalities wth subsequent pregnancies, incisional problems, thromboembolic  phenomenon and other postoperative/anesthesia complications. The patient concurred with the proposed plan, giving informed written consent for the procedure. Patient has been NPO since last night she will remain NPO for procedure. Anesthesia and OR aware. Preoperative prophylactic antibiotics and SCDs ordered on call to the OR. To OR when ready.   Bhavika Schnider L. Harraway-Smith, M.D., Evern CoreFACOG

## 2016-04-25 LAB — CBC
HCT: 32.7 % — ABNORMAL LOW (ref 36.0–46.0)
HEMOGLOBIN: 11 g/dL — AB (ref 12.0–15.0)
MCH: 31.5 pg (ref 26.0–34.0)
MCHC: 33.6 g/dL (ref 30.0–36.0)
MCV: 93.7 fL (ref 78.0–100.0)
Platelets: 230 10*3/uL (ref 150–400)
RBC: 3.49 MIL/uL — ABNORMAL LOW (ref 3.87–5.11)
RDW: 13 % (ref 11.5–15.5)
WBC: 15.6 10*3/uL — ABNORMAL HIGH (ref 4.0–10.5)

## 2016-04-25 NOTE — Lactation Note (Signed)
This note was copied from a baby's chart. Lactation Consultation Note  Patient Name: Vanessa Shannon Reason for consult: Initial assessment  Initial visit at 31 hours of life. Mom is a P3 who is still nursing her 2nd child 61(17 months old). Mom feels that "Vanessa Shannon" is doing well at the breast. Mom reports that she does have a good supply postpartum (at least in the initial months).   Mom made aware of O/P services, breastfeeding support groups, community resources, and our phone # for post-discharge questions.    Vanessa Shannon, Vanessa Shannon [facility], 8:59 PM

## 2016-04-25 NOTE — Clinical Social Work Maternal (Signed)
  CLINICAL SOCIAL WORK MATERNAL/CHILD NOTE  Patient Details  Name: Vanessa Shannon MRN: [id number] Date of Birth: 1975-08-11  Date:  04/25/2016  Clinical Social Worker Initiating Note:  Ferdinand Lango Akeiba Axelson, MSW, LCSW-A  Date/ Time Initiated:  04/25/16/1346     Child's Name:  Walker Shadow   Legal Guardian:  Other (Comment) (Not established by court system; MOB and FOB parent collectively )   Need for Interpreter:  None   Date of Referral:  04/24/16     Reason for Referral:  Late or No Prenatal Care    Referral Source:  [facility]   Address:  [address] [place], [place] [address]  Phone number:  [phone]   Household Members:  Self, Minor Children   Natural Supports (not living in the home):  Spouse/significant other, Extended Family, Immediate Family, Parent   Professional Supports: None   Employment: Full-time   Type of Work:     Education:  Database administrator Resources:  Multimedia programmer   Other Resources:      Cultural/Religious Considerations Which May Impact Care:  None reported at this time.   Strengths:  Ability to meet basic needs , Pediatrician chosen , Compliance with medical plan , Home prepared for child  ([facility] )   Risk Factors/Current Problems:  None   Cognitive State:  Alert , Able to Concentrate , Goal Oriented , Insightful    Mood/Affect:  Calm , Comfortable , Interested    CSW Assessment: CSW met with MOB at bedside to complete assessment. At this time, MOB was resting in bed watching TV while baby was asleep in basinet. This Probation officer explained role and reasoning for visit being due to MOB having [place] @ 36 weeks. MOB acknowledged that she did have [place] but was unable to formulate why she did. MOB noted there are no barriers for getting to and from future medical appointments for she and baby. She notes having a PCP in place already for baby. This Probation officer informed MOB of hospitals policy and procedure regarding  [facility] and drug screens. This Probation officer informed MOB that a UDS and a cord blood test have been taken at which the UDS has come back negative. At this time, no other needs have been addressed or requested. CSW will continue to follow pending cord blood test results. No barriers to d/c found.   CSW Plan/Description:  Other (Comment) (CSW will continue to follow pending cord blood test )   Ferdinand Lango Danne Scardina, MSW, [facility]  Office: [phone]

## 2016-04-25 NOTE — Progress Notes (Signed)
Subjective: Postpartum Day 1: Cesarean Delivery Patient reports incisional pain and tolerating PO.    Objective: Vital signs in last 24 hours: Temp:  [97.6 F (36.4 C)-98.7 F (37.1 C)] 98.2 F (36.8 C) (12/10 0409) Pulse Rate:  [69-88] 70 (12/10 0409) Resp:  [15-22] 18 (12/10 0409) BP: (112-139)/(65-98) 112/69 (12/10 0409) SpO2:  [92 %-100 %] 97 % (12/10 0409)  Physical Exam:  General: alert, cooperative, appears stated age and no distress Lochia: appropriate Uterine Fundus: firm Incision: healing well, no significant drainage, no dehiscence, no significant erythema DVT Evaluation: No evidence of DVT seen on physical exam.   Recent Labs  04/25/16 0618  HGB 11.0*  HCT 32.7*    Assessment/Plan: Status post Cesarean section. Doing well postoperatively.  Continue current care.  Vanessa Shannon 04/25/2016, 9:46 AM

## 2016-04-26 LAB — TYPE AND SCREEN
ABO/RH(D): O POS
ANTIBODY SCREEN: NEGATIVE
UNIT DIVISION: 0
UNIT DIVISION: 0

## 2016-04-26 LAB — BIRTH TISSUE RECOVERY COLLECTION (PLACENTA DONATION)

## 2016-04-26 MED ORDER — OXYCODONE-ACETAMINOPHEN 5-325 MG PO TABS: 1.0000 | ORAL_TABLET | ORAL | 0 refills | Status: DC | PRN

## 2016-04-26 MED ORDER — IBUPROFEN 600 MG PO TABS: 600.0000 mg | ORAL_TABLET | Freq: Four times a day (QID) | ORAL | 0 refills | Status: DC

## 2016-04-26 NOTE — Addendum Note (Signed)
Addendum  created 04/26/16 1319 by Shanon PayorSuzanne M Eli Pattillo, CRNA   Sign clinical note

## 2016-04-26 NOTE — Lactation Note (Signed)
This note was copied from a baby's chart. Lactation Consultation Note  P2, Mother denies problems or concerns. Provided manual pump. Discussed always breastfeeding newborn before older child. Reviewed engorgement care and monitoring voids/stools. Mother states baby has been sleepy since his circ. Reviewed waking techniques.  Encouraged STS.   Patient Name: Vanessa Natasha MeadKhidja Shannon ZOXWR'UToday's Date: 04/26/2016     Maternal Data    Feeding Feeding Type: Breast Fed Length of feed: 20 min  LATCH Score/Interventions                      Lactation Tools Discussed/Used     Consult Status      Vanessa Shannon, Vanessa Shannon 04/26/2016, 11:55 AM

## 2016-04-26 NOTE — Discharge Summary (Signed)
OB Discharge Summary  Patient Name: Vanessa Shannon DOB: [birth year] MRN: [id number]  Date of admission: 04/24/2016 Delivering MD: Willodean RosenthalHARRAWAY-SMITH, CAROLYN   Date of discharge: 04/26/2016  Admitting diagnosis: 39 weeks c-section PREV CS AND DESIRE STERILIZATION Intrauterine pregnancy: 7218w0d     Secondary diagnosis:Principal Problem:   Supervision of other high risk pregnancy, antepartum Active Problems:   Allergic rhinitis   Insufficient prenatal care in third trimester   AMA (advanced maternal age) multigravida 35+, third trimester   Uterine scar from previous cesarean delivery affecting pregnancy   History of gestational hypertension   Group B Streptococcus carrier, antepartum   Postoperative state  Additional problems:none     Discharge diagnosis: Term Pregnancy Delivered                                                                     Post partum procedures:none  Augmentation: none  Complications: None  Hospital course:  Sceduled C/S   40 y.o. yo [id number] at 4118w0d was admitted to the hospital 04/24/2016 for scheduled cesarean section with the following indication:Elective Repeat.  Membrane Rupture Time/Date: 1:20 PM ,04/24/2016   Patient delivered a Viable infant.04/24/2016  Details of operation can be found in separate operative note.  Pateint had an uncomplicated postpartum course.  She is ambulating, tolerating a regular diet, passing flatus, and urinating well. Patient is discharged home in stable condition on  04/26/16          Physical exam Vitals:   04/25/16 0409 04/25/16 0830 04/25/16 1210 04/25/16 1838  BP: 112/69 (!) 104/57 (!) 113/48 117/73  Pulse: 70 79 79 82  Resp: 18 18 18 18   Temp: 98.2 F (36.8 C) 98.7 F (37.1 C) 98.8 F (37.1 C) 98.7 F (37.1 C)  TempSrc: Oral Oral Oral Oral  SpO2: 97% 100% 100%    General: alert, cooperative and no distress Lochia: appropriate Uterine Fundus: firm Incision: Healing well with no significant drainage,  No significant erythema, Dressing is clean, dry, and intact DVT Evaluation: No evidence of DVT seen on physical exam. Labs: Lab Results  Component Value Date   WBC 15.6 (H) 04/25/2016   HGB 11.0 (L) 04/25/2016   HCT 32.7 (L) 04/25/2016   MCV 93.7 04/25/2016   PLT 230 04/25/2016   CMP Latest Ref Rng & Units 03/23/2016  Glucose 65 - 99 mg/dL 80  BUN 6 - 20 mg/dL 6  Creatinine 1.470.44 - 8.291.00 mg/dL 5.620.47  Sodium 130135 - 865145 mmol/L 135  Potassium 3.5 - 5.1 mmol/L 3.6  Chloride 101 - 111 mmol/L 103  CO2 22 - 32 mmol/L 21(L)  Calcium 8.9 - 10.3 mg/dL 9.0  Total Protein 6.5 - 8.1 g/dL 7.2  Total Bilirubin 0.3 - 1.2 mg/dL 0.4  Alkaline Phos 38 - 126 U/L 71  AST 15 - 41 U/L 15  ALT 14 - 54 U/L 11(L)    Discharge instruction: per After Visit Summary and "Baby and Me Booklet".  After Visit Meds:    Medication List    STOP taking these medications   diphenhydrAMINE 25 MG tablet Commonly known as:  BENADRYL     TAKE these medications   acetaminophen 500 MG tablet Commonly known as:  TYLENOL Take 500 mg  by mouth every 6 (six) hours as needed.   ibuprofen 600 MG tablet Commonly known as:  ADVIL,MOTRIN Take 1 tablet (600 mg total) by mouth every 6 (six) hours.   oxyCODONE-acetaminophen 5-325 MG tablet Commonly known as:  PERCOCET/ROXICET Take 1 tablet by mouth every 4 (four) hours as needed (pain scale 4-7).   prenatal multivitamin Tabs tablet Take 1 tablet by mouth daily at 12 noon.       Diet: routine diet  Activity: Advance as tolerated. Pelvic rest for 6 weeks.   Outpatient follow up:6 weeks Follow up Appt:No future appointments. Follow up visit: No Follow-up on file.  Postpartum contraception: Tubal Ligation  Newborn Data: Live born female  Birth Weight: 6 lb 14.1 oz (3120 g) APGAR: 9, 10  Baby Feeding: Bottle Disposition:home with mother   04/26/2016 Wyvonnia DuskyMarie Latyra Jaye, CNM

## 2016-04-26 NOTE — Anesthesia Postprocedure Evaluation (Signed)
Anesthesia Post Note  Patient: Vanessa Shannon  Procedure(s) Performed: Procedure(s) (LRB): CESAREAN SECTION WITH BILATERAL TUBAL LIGATION (Bilateral)  Patient location during evaluation: Mother Baby Anesthesia Type: Spinal Level of consciousness: awake and alert and oriented Pain management: satisfactory to patient Vital Signs Assessment: post-procedure vital signs reviewed and stable Respiratory status: spontaneous breathing and nonlabored ventilation Cardiovascular status: stable Postop Assessment: no headache, no backache, patient able to bend at knees, no signs of nausea or vomiting and adequate PO intake Anesthetic complications: no     Last Vitals:  Vitals:   04/25/16 1838 04/26/16 0531  BP: 117/73 (!) 107/59  Pulse: 82 72  Resp: 18 18  Temp: 37.1 C 37.1 C    Last Pain:  Vitals:   04/26/16 1145  TempSrc:   PainSc: 4    Pain Goal: Patients Stated Pain Goal: 2 (04/26/16 0735)               Madison HickmanGREGORY,Edsel Shives

## 2016-04-27 ENCOUNTER — Encounter: Payer: Self-pay | Admitting: *Deleted

## 2016-06-11 ENCOUNTER — Ambulatory Visit (INDEPENDENT_AMBULATORY_CARE_PROVIDER_SITE_OTHER): Payer: 59 | Admitting: Advanced Practice Midwife

## 2016-06-11 ENCOUNTER — Encounter: Payer: Self-pay | Admitting: Advanced Practice Midwife

## 2016-06-11 DIAGNOSIS — Z9889 Other specified postprocedural states: Secondary | ICD-10-CM

## 2016-06-11 DIAGNOSIS — L929 Granulomatous disorder of the skin and subcutaneous tissue, unspecified: Secondary | ICD-10-CM

## 2016-06-11 DIAGNOSIS — Z98891 History of uterine scar from previous surgery: Secondary | ICD-10-CM

## 2016-06-11 NOTE — Progress Notes (Signed)
Error

## 2016-06-11 NOTE — Progress Notes (Signed)
Post Partum Exam  Vanessa Shannon is a 41 y.o. [phone] female who presents for a postpartum visit. She is 7 weeks postpartum following a low cervical transverse Cesarean section. I have fully reviewed the prenatal and intrapartum course. The delivery was at 39 gestational weeks.  Anesthesia: epidural. Postpartum course has been unremarkable. Baby's course has been unremarkable. Baby is feeding by breast and bottle - Enfamil with Iron and Enfamil newborn. Is tandem feeding w/ 3740-month old, but baby will not latch. Having poor supply. Bleeding no bleeding. Bowel function is normal. Bladder function is normal. Patient is not sexually active. Contraception method is tubal ligation. Postpartum depression screening:neg  The following portions of the patient's history were reviewed and updated as appropriate: allergies, current medications, past family history, past medical history, past social history, past surgical history and problem list.  Review of Systems Integument/breast: positive for skin lesion(s)  Has few bumps and scant drainage from C/S incision.   Objective:    BP 116/78 mmHg  Pulse 78  Resp 16  Ht 5\' 5"  (1.651 m)  Wt 211 lb (95.709 kg)  BMI 35.11 kg/m2  Breastfeeding? Yes  General:  alert, cooperative, appears stated age and no distress   Breasts:  declined  Lungs: clear to auscultation bilaterally  Heart:  regular rate and rhythm, S1, S2 normal, no murmur, click, rub or gallop  Abdomen: soft, non-tender; bowel sounds normal; no masses,  no organomegaly and three areas of granulation tissue. Incision healing well. No bleeding erythema or opening of incision. Granulation tissue is weeping scant clear, odorless fluid.    Vulva:  not evaluated  Vagina: not evaluated  Rectal Exam: Not performed.        Procedure Silver nitrate applied to granulation tissue. Pt tolerated well.  Assessment:    Normal postpartum exam. Pap smear not done at today's visit.  1. Postpartum care and  examination   2. S/P cesarean section   3. Postoperative granulation of tissue    Plan:   1. Contraception: tubal ligation 2. Recommend lactation consultation  3. Follow up in:  weekly for silver nitrate application to granulation tissue or as needed.

## 2016-06-21 ENCOUNTER — Encounter: Payer: Self-pay | Admitting: Obstetrics & Gynecology

## 2016-06-21 ENCOUNTER — Ambulatory Visit (INDEPENDENT_AMBULATORY_CARE_PROVIDER_SITE_OTHER): Payer: 59 | Admitting: Obstetrics & Gynecology

## 2016-06-21 VITALS — BP 168/88 | HR 86 | Resp 16 | Ht 64.0 in | Wt 177.0 lb

## 2016-06-21 DIAGNOSIS — Z5189 Encounter for other specified aftercare: Secondary | ICD-10-CM

## 2016-06-21 NOTE — Progress Notes (Signed)
   GYNECOLOGY OFFICE VISIT NOTE  History:  41 y.o. [id number] here today for incision re-check. Was seen for postpartum visit on 06/11/16, areas of cesarean incision were treated with silver nitrate. Since then, no drainage, tenderness or other problems reported by patient. She denies any abnormal vaginal discharge, bleeding, pelvic pain or other concerns.  Her baby is doing well.  Past Medical History:  Diagnosis Date  . Anemia 2002   postop hemorrhage  . Blood transfusion without reported diagnosis    uterine laceration with first CS post cesarean section  . GERD (gastroesophageal reflux disease)   . Headache   . Heartburn during pregnancy     Past Surgical History:  Procedure Laterality Date  . CESAREAN SECTION    . CESAREAN SECTION N/A 11/19/2014   Procedure: CESAREAN SECTION;  Surgeon: Lavina Hammanodd Meisinger, MD;  Location: [facility];  Service: Obstetrics;  Laterality: N/A;  . CESAREAN SECTION WITH BILATERAL TUBAL LIGATION Bilateral 04/24/2016   Procedure: CESAREAN SECTION WITH BILATERAL TUBAL LIGATION;  Surgeon: Willodean Rosenthalarolyn Harraway-Smith, MD;  [facility];  Service: Obstetrics;  Laterality: Bilateral;    The following portions of the patient's history were reviewed and updated as appropriate: allergies, current medications, past family history, past medical history, past social history, past surgical history and problem list.    Review of Systems:  Pertinent items noted in HPI and remainder of comprehensive ROS otherwise negative.   Objective:  Physical Exam BP (!) 168/88   Pulse 86   Resp 16   Ht 5\' 4"  (1.626 m)   Wt 177 lb (80.3 kg)   Breastfeeding? Yes   BMI 30.38 kg/m  CONSTITUTIONAL: Well-developed, well-nourished female in no acute distress.  HENT:  Normocephalic, atraumatic. External right and left ear normal. Oropharynx is clear and moist EYES: Conjunctivae and EOM are normal. Pupils are equal, round, and reactive to light. No scleral icterus.  NECK: Normal  range of motion, supple, no masses SKIN: Skin is warm and dry. No rash noted. Not diaphoretic. No erythema. No pallor. NEUROLOGIC: Alert and oriented to person, place, and time. Normal reflexes, muscle tone coordination. No cranial nerve deficit noted. PSYCHIATRIC: Normal mood and affect. Normal behavior. Normal judgment and thought content. CARDIOVASCULAR: Normal heart rate noted RESPIRATORY: Effort and breath sounds normal, no problems with respiration noted ABDOMEN: Soft, no distention noted.  Well-healing incision, no open areas, no erythema, no induration PELVIC: Deferred MUSCULOSKELETAL: Normal range of motion. No edema noted.   Assessment & Plan:  1. Encounter for wound re-check Incision is healing well, no further problems. Told to return for any GYN concerns. Routine preventative health maintenance measures emphasized. Please refer to After Visit Summary for other counseling recommendations.     Jaynie CollinsUGONNA  Joshlyn Beadle, MD, FACOG Attending Obstetrician & Gynecologist, [facility], [facility]

## 2016-06-21 NOTE — Patient Instructions (Signed)
Return to clinic for any scheduled appointments or for any gynecologic concerns as needed.   

## 2017-01-19 NOTE — Progress Notes (Signed)
HPI: Vanessa Shannon is a pleasant 41 yo, not seen in several year, here for an acute visit for HTN. She was on propranolol in the past postpartum for hypertension. She then was able to come off this medication. She had another child about 9 months ago. Her blood pressure started to run high again about 2-3 months ago. She has gained some weight with lack of exercise and a poor diet. She plans to improve. She is breast-feeding. She reports that her blood pressure has been running in the 140s over 90s consistently and higher on several occasions. She denies chest pain, shortness of breath, headaches or vision changes. She would like to restart the propranolol.  ROS: See pertinent positives and negatives per HPI.  Past Medical History:  Diagnosis Date  . Anemia 2002   postop hemorrhage  . Blood transfusion without reported diagnosis    uterine laceration with first CS post cesarean section  . GERD (gastroesophageal reflux disease)   . Headache   . Heartburn during pregnancy   . History of 3 cesarean sections 03/23/2016   P1 in 2002: pushed 3 hours before C/S, had cervical tear and PPH P2 in 2016: scheduled repeat LTCS    Past Surgical History:  Procedure Laterality Date  . CESAREAN SECTION    . CESAREAN SECTION N/A 11/19/2014   Procedure: CESAREAN SECTION;  Surgeon: Lavina Hamman, MD;  Location: [facility];  Service: Obstetrics;  Laterality: N/A;  . CESAREAN SECTION WITH BILATERAL TUBAL LIGATION Bilateral 04/24/2016   Procedure: CESAREAN SECTION WITH BILATERAL TUBAL LIGATION;  Surgeon: Willodean Rosenthal, MD;  Location: [facility];  Service: Obstetrics;  Laterality: Bilateral;    Family History  Problem Relation Age of Onset  . Lupus Mother   . Hypertension Mother   . Cancer Father        brain cancer  . Stroke Maternal Grandmother   . Hypertension Maternal Grandmother   . Stroke Paternal Grandfather     Social History   Social History  . Marital status: Single    Spouse  name: N/A  . Number of children: N/A  . Years of education: N/A   Social History Main Topics  . Smoking status: Never Smoker  . Smokeless tobacco: Never Used  . Alcohol use Yes     Comment: occ/social   . Drug use: No  . Sexual activity: Yes    Birth control/ protection: Surgical   Other Topics Concern  . None   Social History Narrative   Work or School: Psychologist, sport and exercise at [employer]; going back to school for nursing      Home Situation: lives with daughter (62 yr old in 2014) and grandmother      Spiritual Beliefs: Christian      Lifestyle: no regular exercise; diet is horrible              Current Outpatient Prescriptions:  .  acetaminophen (TYLENOL) 500 MG tablet, Take 500 mg by mouth every 6 (six) hours as needed., Disp: , Rfl:  .  Prenatal Vit-Fe Fumarate-FA (PRENATAL MULTIVITAMIN) TABS tablet, Take 1 tablet by mouth daily at 12 noon. , Disp: , Rfl:  .  propranolol (INDERAL) 40 MG tablet, Take 1 tablet (40 mg total) by mouth 2 (two) times daily., Disp: 60 tablet, Rfl: 3  EXAM:  Vitals:   01/20/17 1152  BP: (!) 128/98  Pulse: 82  Temp: 98.3 F (36.8 C)    Body mass index is 32.22 kg/m.  GENERAL: vitals  reviewed and listed above, alert, oriented, appears well hydrated and in no acute distress  HEENT: atraumatic, conjunttiva clear, no obvious abnormalities on inspection of external nose and ears  NECK: no obvious masses on inspection  LUNGS: clear to auscultation bilaterally, no wheezes, rales or rhonchi, good air movement  CV: HRRR, no peripheral edema  MS: moves all extremities without noticeable abnormality  PSYCH: pleasant and cooperative, no obvious depression or anxiety  ASSESSMENT AND PLAN:  Discussed the following assessment and plan:  Essential hypertension - Plan: Basic metabolic panel, CBC  BMI 32.0-32.9,adult  Lactating mother  -discussed treatment options at length for hypertension and elevated BMI -Advised a healthy whole foods  based diet, low in sugar, processed foods and simple starches along with regular exercise -We opted to restart propranolol since she tolerated this well and it is a medication that is safer and lactating mothers in terms of passing on the medication to the child -Follow up in 1 month to recheck -Labs per orders -declined flu shot -Patient advised to return or notify a doctor immediately if symptoms worsen or persist or new concerns arise.  Patient Instructions  BEFORE YOU LEAVE: -follow up: 1 month -labs  Start the propranolol and take twice daily as instructed.  We have ordered labs or studies at this visit. It can take up to 1-2 weeks for results and processing. IF results require follow up or explanation, we will call you with instructions. Clinically stable results will be released to your [facility]. If you have not heard from [place] or cannot find your results in [facility] in 2 weeks please contact our office at [phone].  If you are not yet signed up for [facility], please consider signing up.   We recommend the following healthy lifestyle for LIFE: 1) Small portions.   Tip: eat off of a salad plate instead of a dinner plate.  Tip: It is ok to feel hungry after a meal - that likely means you ate an appropriate portion.  Tip: if you need more or a snack choose fruits, veggies and/or a handful of nuts or seeds.  2) Eat a healthy clean diet.   TRY TO EAT: -at least 5-7 servings of low sugar vegetables per day (not corn, potatoes or bananas.) -berries are the best choice if you wish to eat fruit.   -lean meets (fish, chicken or [place] breasts) -vegan proteins for some meals - beans or tofu, whole grains, nuts and seeds -Replace bad fats with good fats - good fats include: fish, nuts and seeds, canola oil, olive oil -small amounts of low fat or non fat dairy -small amounts of100 % whole grains - check the lables  AVOID: -SUGAR, sweets, anything with added sugar, corn syrup or  sweeteners -if you must have a sweetener, small amounts of stevia may be best -sweetened beverages -simple starches (rice, bread, potatoes, pasta, chips, etc - small amounts of 100% whole grains are ok) -red meat, pork, butter -fried foods, fast food, processed food, excessive dairy, eggs and coconut.  3)Get at least 150 minutes of sweaty aerobic exercise per week.  4)Reduce stress - consider counseling, meditation and relaxation to balance other aspects of your life.   WE NOW OFFER    [facility] FAST TRACK!!!  SAME DAY Appointments for ACUTE CARE  Such as: Sprains, Injuries, cuts, abrasions, rashes, muscle pain, joint pain, back pain Colds, flu, sore throats, headache, allergies, cough, fever  Ear pain, sinus and eye infections Abdominal pain, nausea, vomiting, diarrhea, upset  stomach Animal/insect bites  3 Easy Ways to Schedule: Walk-In Scheduling Call in scheduling Mychart Sign-up: [email or web address]              Kriste BasqueKIM, Sanjuan Sawa R., DO

## 2017-01-20 ENCOUNTER — Encounter: Payer: Self-pay | Admitting: Family Medicine

## 2017-01-20 ENCOUNTER — Ambulatory Visit (INDEPENDENT_AMBULATORY_CARE_PROVIDER_SITE_OTHER): Payer: 59 | Admitting: Family Medicine

## 2017-01-20 VITALS — BP 128/98 | HR 82 | Temp 98.3°F | Ht 64.0 in | Wt 187.7 lb

## 2017-01-20 DIAGNOSIS — Z6832 Body mass index (BMI) 32.0-32.9, adult: Secondary | ICD-10-CM

## 2017-01-20 DIAGNOSIS — I10 Essential (primary) hypertension: Secondary | ICD-10-CM | POA: Insufficient documentation

## 2017-01-20 LAB — BASIC METABOLIC PANEL
BUN: 9 mg/dL (ref 6–23)
CHLORIDE: 104 meq/L (ref 96–112)
CO2: 29 mEq/L (ref 19–32)
Calcium: 9 mg/dL (ref 8.4–10.5)
Creatinine, Ser: 0.68 mg/dL (ref 0.40–1.20)
GFR: 121.99 mL/min (ref >60.00)
GLUCOSE: 110 mg/dL — AB (ref 70–99)
POTASSIUM: 3.5 meq/L (ref 3.5–5.1)
Sodium: 140 mEq/L (ref 135–145)

## 2017-01-20 LAB — CBC
HEMATOCRIT: 39.4 % (ref 36.0–46.0)
HEMOGLOBIN: 13 g/dL (ref 12.0–15.0)
MCHC: 33 g/dL (ref 30.0–36.0)
MCV: 93.2 fl (ref 78.0–100.0)
Platelets: 218 10*3/uL (ref 150.0–400.0)
RBC: 4.22 Mil/uL (ref 3.87–5.11)
RDW: 12.2 % (ref 11.5–15.5)
WBC: 5.5 10*3/uL (ref 4.0–10.5)

## 2017-01-20 MED ORDER — PROPRANOLOL HCL 40 MG PO TABS: 40.0000 mg | ORAL_TABLET | Freq: Two times a day (BID) | ORAL | 3 refills | Status: DC

## 2017-01-20 NOTE — Patient Instructions (Signed)
BEFORE YOU LEAVE: -follow up: 1 month -labs  Start the propranolol and take twice daily as instructed.  We have ordered labs or studies at this visit. It can take up to 1-2 weeks for results and processing. IF results require follow up or explanation, we will call you with instructions. Clinically stable results will be released to your [facility]. If you have not heard from us or cannot find your results in [facility] in 2 weeks please contact our office at [phone].  If you are not yet signed up for [facility], please consider signing up.   We recommend the following healthy lifestyle for LIFE: 1) Small portions.   Tip: eat off of a salad plate instead of a dinner plate.  Tip: It is ok to feel hungry after a meal - that likely means you ate an appropriate portion.  Tip: if you need more or a snack choose fruits, veggies and/or a handful of nuts or seeds.  2) Eat a healthy clean diet.   TRY TO EAT: -at least 5-7 servings of low sugar vegetables per day (not corn, potatoes or bananas.) -berries are the best choice if you wish to eat fruit.   -lean meets (fish, chicken or Malaw[place]urkey breasts) -vegan proteins for some meals - beans or tofu, whole grains, nuts and seeds -Replace bad fats with good fats - good fats include: fish, nuts and seeds, canola oil, olive oil -small amounts of low fat or non fat dairy -small amounts of100 % whole grains - check the lables  AVOID: -SUGAR, sweets, anything with added sugar, corn syrup or sweeteners -if you must have a sweetener, small amounts of stevia may be best -sweetened beverages -simple starches (rice, bread, potatoes, pasta, chips, etc - small amounts of 100% whole grains are ok) -red meat, pork, butter -fried foods, fast food, processed food, excessive dairy, eggs and coconut.  3)Get at least 150 minutes of sweaty aerobic exercise per week.  4)Reduce stress - consider counseling, meditation and relaxation to balance other aspects of your  life.   WE NOW OFFER    [facility] FAST TRACK!!!  SAME DAY Appointments for ACUTE CARE  Such as: Sprains, Injuries, cuts, abrasions, rashes, muscle pain, joint pain, back pain Colds, flu, sore throats, headache, allergies, cough, fever  Ear pain, sinus and eye infections Abdominal pain, nausea, vomiting, diarrhea, upset stomach Animal/insect bites  3 Easy Ways to Schedule: Walk-In Scheduling Call in scheduling Mychart Sign-up: [email or web address]

## 2017-02-20 NOTE — Progress Notes (Deleted)
HPI:  Follow up:  HTN: -restarted propranolol (per her preference during lactation) medication 01/20/17  Obesity: -wt 01/2017 187 -->  ROS: See pertinent positives and negatives per HPI.  Past Medical History:  Diagnosis Date  . Anemia 2002   postop hemorrhage  . Blood transfusion without reported diagnosis    uterine laceration with first CS post cesarean section  . GERD (gastroesophageal reflux disease)   . Headache   . Heartburn during pregnancy   . History of 3 cesarean sections 03/23/2016   P1 in 2002: pushed 3 hours before C/S, had cervical tear and PPH P2 in 2016: scheduled repeat LTCS    Past Surgical History:  Procedure Laterality Date  . CESAREAN SECTION    . CESAREAN SECTION N/A 11/19/2014   Procedure: CESAREAN SECTION;  Surgeon: Lavina Hamman, MD;  Location: [facility];  Service: Obstetrics;  Laterality: N/A;  . CESAREAN SECTION WITH BILATERAL TUBAL LIGATION Bilateral 04/24/2016   Procedure: CESAREAN SECTION WITH BILATERAL TUBAL LIGATION;  Surgeon: Willodean Rosenthal, MD;  Location: [facility] SU[facility];  Service: Obstetrics;  Laterality: Bilateral;    Family History  Problem Relation Age of Onset  . Lupus Mother   . Hypertension Mother   . Cancer Father        brain cancer  . Stroke Maternal Grandmother   . Hypertension Maternal Grandmother   . Stroke Paternal Grandfather     Social History   Social History  . Marital status: Single    Spouse name: N/A  . Number of children: N/A  . Years of education: N/A   Social History Main Topics  . Smoking status: Never Smoker  . Smokeless tobacco: Never Used  . Alcohol use Yes     Comment: occ/social   . Drug use: No  . Sexual activity: Yes    Birth control/ protection: Surgical   Other Topics Concern  . Not on file   Social History Narrative   Work or School: Psychologist, sport and exercise at [employer]; going back to school for nursing      Home Situation: lives with daughter (87 yr old in 2014) and grandmother      Spiritual Beliefs: Christian      Lifestyle: no regular exercise; diet is horrible              Current Outpatient Prescriptions:  .  acetaminophen (TYLENOL) 500 MG tablet, Take 500 mg by mouth every 6 (six) hours as needed., Disp: , Rfl:  .  Prenatal Vit-Fe Fumarate-FA (PRENATAL MULTIVITAMIN) TABS tablet, Take 1 tablet by mouth daily at 12 noon. , Disp: , Rfl:  .  propranolol (INDERAL) 40 MG tablet, Take 1 tablet (40 mg total) by mouth 2 (two) times daily., Disp: 60 tablet, Rfl: 3  EXAM:  There were no vitals filed for this visit.  There is no height or weight on file to calculate BMI.  GENERAL: vitals reviewed and listed above, alert, oriented, appears well hydrated and in no acute distress  HEENT: atraumatic, conjunttiva clear, no obvious abnormalities on inspection of external nose and ears  NECK: no obvious masses on inspection  LUNGS: clear to auscultation bilaterally, no wheezes, rales or rhonchi, good air movement  CV: HRRR, no peripheral edema  MS: moves all extremities without noticeable abnormality  PSYCH: pleasant and cooperative, no obvious depression or anxiety  ASSESSMENT AND PLAN:  Discussed the following assessment and plan:  No diagnosis found.  -Patient advised to return or notify a doctor immediately if symptoms worsen or  persist or new concerns arise.  There are no Patient Instructions on file for this visit.  Colin Benton R., DO

## 2017-02-21 ENCOUNTER — Ambulatory Visit: Payer: 59 | Admitting: Family Medicine

## 2017-05-02 ENCOUNTER — Ambulatory Visit (HOSPITAL_COMMUNITY)
Admission: EM | Admit: 2017-05-02 | Discharge: 2017-05-02 | Disposition: A | Discharge status: Home / Self Care | Payer: 59 | Attending: Family Medicine | Admitting: Family Medicine

## 2017-05-02 ENCOUNTER — Encounter (HOSPITAL_COMMUNITY): Payer: Self-pay | Admitting: *Deleted

## 2017-05-02 ENCOUNTER — Other Ambulatory Visit: Payer: Self-pay

## 2017-05-02 DIAGNOSIS — H109 Unspecified conjunctivitis: Secondary | ICD-10-CM

## 2017-05-02 DIAGNOSIS — S0501XA Injury of conjunctiva and corneal abrasion without foreign body, right eye, initial encounter: Secondary | ICD-10-CM

## 2017-05-02 DIAGNOSIS — H579 Unspecified disorder of eye and adnexa: Secondary | ICD-10-CM | POA: Diagnosis not present

## 2017-05-02 DIAGNOSIS — H5789 Other specified disorders of eye and adnexa: Secondary | ICD-10-CM

## 2017-05-02 MED ORDER — CIPROFLOXACIN HCL 0.3 % OP OINT: TOPICAL_OINTMENT | OPHTHALMIC | 0 refills | Status: DC

## 2017-05-02 NOTE — ED Triage Notes (Addendum)
Clear drainage out of rt eye, nasal drainage, per pt it started this Tuesday, per pt she was taking antibiotics eye drops which is polymyxine-B. Per pt eye pressure when bend over

## 2017-05-02 NOTE — Discharge Instructions (Signed)
Use the eye ointment as directed. Call the ophthalmologist as it on this page for an appointment for tomorrow or the next day.

## 2017-05-02 NOTE — ED Provider Notes (Signed)
[facility]    CSN: [id number] Arrival date & time: 05/02/17  1127     History   Chief Complaint Chief Complaint  Patient presents with  . Eye Problem    HPI Vanessa Shannon is a 41 y.o. female.   41 year old female states that about one week ago she started getting a gritty feeling to the right eye. She started using an antibiotic drop containing polymyxin B for 4 days. This did not help. She has been rubbing and scratching her eyes because it is been itchy and watery. The watering of the eye started just a few days later and has been clear. No purulent drainage. She has not had an high amount of pain in the eye but she does have pressure and discomfort when leaning forward. Her vision is a little blurry. No known history of trauma this past week.      Past Medical History:  Diagnosis Date  . Anemia 2002   postop hemorrhage  . Blood transfusion without reported diagnosis    uterine laceration with first CS post cesarean section  . GERD (gastroesophageal reflux disease)   . Headache   . Heartburn during pregnancy   . History of 3 cesarean sections 03/23/2016   P1 in 2002: pushed 3 hours before C/S, had cervical tear and PPH P2 in 2016: scheduled repeat LTCS    Patient Active Problem List   Diagnosis Date Noted  . Lactating mother 01/20/2017  . Essential hypertension 01/20/2017  . Allergic rhinitis 08/09/2013    Past Surgical History:  Procedure Laterality Date  . CESAREAN SECTION    . CESAREAN SECTION N/A 11/19/2014   Procedure: CESAREAN SECTION;  Surgeon: Lavina Hammanodd Meisinger, MD;  Location: [facility];  Service: Obstetrics;  Laterality: N/A;  . CESAREAN SECTION WITH BILATERAL TUBAL LIGATION Bilateral 04/24/2016   Procedure: CESAREAN SECTION WITH BILATERAL TUBAL LIGATION;  Surgeon: Willodean Rosenthalarolyn Harraway-Smith, MD;  [facility];  Service: Obstetrics;  Laterality: Bilateral;    OB History    Gravida Para Term Preterm AB Living   5 3 3   2 3    SAB TAB  Ectopic Multiple Live Births   1     0 3       Home Medications    Prior to Admission medications   Medication Sig Start Date End Date Taking? Authorizing Provider  acetaminophen (TYLENOL) 500 MG tablet Take 500 mg by mouth every 6 (six) hours as needed.   Yes [provider]  Prenatal Vit-Fe Fumarate-FA (PRENATAL MULTIVITAMIN) TABS tablet Take 1 tablet by mouth daily at 12 noon.    Yes [provider]  propranolol (INDERAL) 40 MG tablet Take 1 tablet (40 mg total) by mouth 2 (two) times daily. 01/20/17  Yes Terressa KoyanagiKim, Hannah R, DO  ciprofloxacin (CILOXAN) 0.3 % ophthalmic ointment Apply 1/2 inch ointment to the right eye tid 05/02/17   Hayden RasmussenMabe, Wylie Coon, NP    Family History Family History  Problem Relation Age of Onset  . Lupus Mother   . Hypertension Mother   . Cancer Father        brain cancer  . Stroke Maternal Grandmother   . Hypertension Maternal Grandmother   . Stroke Paternal Grandfather     Social History Social History   Tobacco Use  . Smoking status: Never Smoker  . Smokeless tobacco: Never Used  Substance Use Topics  . Alcohol use: Yes    Comment: occ/social   . Drug use: No  Allergies   Patient has no known allergies.   Review of Systems Review of Systems   Physical Exam Triage Vital Signs ED Triage Vitals  Enc Vitals Group     BP 05/02/17 1230 (!) 159/88     Pulse Rate 05/02/17 1230 71     Resp --      Temp 05/02/17 1230 99.1 F (37.3 C)     Temp Source 05/02/17 1230 Oral     SpO2 05/02/17 1230 99 %     Weight --      Height --      Head Circumference --      Peak Flow --      Pain Score 05/02/17 1227 2     Pain Loc --      Pain Edu? --      Excl. in GC? --    No data found.  Updated Vital Signs BP (!) 178/82 (BP Location: Left Arm)   Pulse (!) 103   Temp 98.7 F (37.1 C) (Oral)   LMP 04/18/2017 (Approximate)   SpO2 98%   Visual Acuity Right Eye Distance: 20/50 Left Eye Distance: 20/25 Bilateral Distance:     Right Eye Near:   Left Eye Near:    Bilateral Near:     Physical Exam  Eyes: EOM are normal. Pupils are equal, round, and reactive to light. Lids are everted and swept, no foreign bodies found. Right eye exhibits discharge. Right eye exhibits no exudate and no hordeolum. No foreign body present in the right eye. Right conjunctiva is injected. Right conjunctiva has no hemorrhage.       UC Treatments / Results  Labs (all labs ordered are listed, but only abnormal results are displayed) Labs Reviewed - No data to display  EKG  EKG Interpretation None       Radiology No results found.  Procedures Procedures (including critical care time)  Medications Ordered in UC Medications - No data to display   Initial Impression / Assessment and Plan / UC Course  I have reviewed the triage vital signs and the nursing notes.  Pertinent labs & imaging results that were available during my care of the patient were reviewed by me and considered in my medical decision making (see chart for details).    Use the eye ointment as directed. Call the ophthalmologist as it on this page for an appointment for tomorrow or the next day.   Final Clinical Impressions(s) / UC Diagnoses   Final diagnoses:  Conjunctivitis of right eye, unspecified conjunctivitis type  Abrasion of right cornea, initial encounter  Scleral injection    ED Discharge Orders        Ordered    ciprofloxacin (CILOXAN) 0.3 % ophthalmic ointment     05/02/17 1316       Controlled Substance Prescriptions  Controlled Substance Registry consulted? Not Applicable   Hayden RasmussenMabe, Braeden Kennan, NP 05/02/17 2117

## 2017-06-22 ENCOUNTER — Other Ambulatory Visit: Payer: Self-pay | Admitting: Family Medicine

## 2017-07-31 ENCOUNTER — Other Ambulatory Visit: Payer: Self-pay | Admitting: Family Medicine

## 2017-08-24 ENCOUNTER — Telehealth: Payer: Self-pay | Admitting: Family Medicine

## 2017-08-24 NOTE — Telephone Encounter (Signed)
[facility] and would like for the hi-lited area to only be completed by Dr. Selena BattenKim.  Pt would like to have a call back at [phone] when form is completed.  Form was put in doctors folder for completion.

## 2017-08-25 NOTE — Telephone Encounter (Signed)
I left a detailed message at the pts number to call the office to schedule an appt for a physical in order for the form to be completed.

## 2017-08-25 NOTE — Telephone Encounter (Signed)
Form back to Vanessa Shannon. Please always check on physical forms if pt has had physical. If not please schedule. Please check on her. Also will need complete vaccine record. Thanks.

## 2017-08-30 NOTE — Telephone Encounter (Signed)
Per Fleet Contrasachel, pt came in to the office on 4/15 and picked up the form.

## 2018-06-15 ENCOUNTER — Ambulatory Visit: Payer: Self-pay | Admitting: Nurse Practitioner

## 2018-06-15 VITALS — BP 140/82 | HR 97 | Temp 98.5°F | Resp 16 | Ht 64.0 in | Wt 185.0 lb

## 2018-06-15 DIAGNOSIS — I10 Essential (primary) hypertension: Secondary | ICD-10-CM

## 2018-06-15 DIAGNOSIS — M25561 Pain in right knee: Secondary | ICD-10-CM

## 2018-06-15 DIAGNOSIS — L309 Dermatitis, unspecified: Secondary | ICD-10-CM

## 2018-06-15 MED ORDER — PROPRANOLOL HCL 40 MG PO TABS: 40.0000 mg | ORAL_TABLET | Freq: Two times a day (BID) | ORAL | 1 refills | Status: DC

## 2018-06-15 NOTE — Patient Instructions (Addendum)
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 I am giving you some inderal to bridge you until you can get back to see your PCP  Please monitor your blood pressure Work on cutting back on salt Get over the counter hydrocortisone cream for eczema Use heat and massage to right lower leg; monitor area If you develop any symptoms or headache, dizziness or blurred vision please go to urgent care or the ED right away Start off with 1 tab a day and if your blood pressure stays over 140/90 and consistent increase to 1 tab by mouth twice daily Return to the clinic as needed

## 2018-06-15 NOTE — Progress Notes (Signed)
   Subjective:    Patient ID: Vanessa Shannon, female    DOB: [birth year], 43 y.o.   MRN: [id number]  HPI Vanessa Shannon is here today with reports of knots in her lower leg that have been there for a month and comes and goes. 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 She denies any dizzines, h/a, blurred vision, edema, chest pain, SOB or wheezing. She reports a flare up of eczema and no self treatment; she's taken triamcinolone in the past. She admits being back in school with a limited work schedule she has lost her insurance and therefore has not followed back up with PCP for eval/tx.    Review of Systems  Constitutional: Negative for fatigue and fever.  Eyes: Negative for visual disturbance.  Respiratory: Negative for shortness of breath.   Cardiovascular: Negative for chest pain.  Skin: Positive for color change.  Neurological: Negative for dizziness and headaches.       Objective:   Physical Exam Vitals signs reviewed.  Constitutional:      Appearance: Normal appearance. She is well-developed.  HENT:     Head: Normocephalic.  Neck:     Musculoskeletal: Normal range of motion and neck supple. No muscular tenderness.     Vascular: No carotid bruit.  Cardiovascular:     Rate and Rhythm: Normal rate and regular rhythm.     Heart sounds: Normal heart sounds.  Pulmonary:     Effort: Pulmonary effort is normal. No respiratory distress.     Breath sounds: Normal breath sounds.  Abdominal:     General: Bowel sounds are normal.     Palpations: Abdomen is soft.  Musculoskeletal: Normal range of motion.  Skin:    General: Skin is warm and dry.     Findings: Erythema and rash present.     Comments: No nodules or masses palpated to bilateral lower legs. No tenderness on palpation. No edema or erythema to lower extremities.  Right a/c area with well demarcated slightly raised  irregular shaped erythematous areas. Some scattered patchy areas of the same presentation to right medial aspect of her arm. No scaling noted.  Neurological:     Mental Status: She is alert and oriented to person, place, and time.  Psychiatric:        Mood and Affect: Mood normal.           Assessment & Plan:

## 2019-06-21 ENCOUNTER — Ambulatory Visit: Payer: Medicaid Other | Admitting: Family Medicine

## 2019-06-21 ENCOUNTER — Encounter: Payer: Self-pay | Admitting: Family Medicine

## 2019-06-21 VITALS — BP 142/98 | HR 92

## 2019-06-21 DIAGNOSIS — Z789 Other specified health status: Secondary | ICD-10-CM

## 2019-06-21 DIAGNOSIS — R030 Elevated blood-pressure reading, without diagnosis of hypertension: Secondary | ICD-10-CM

## 2019-06-21 NOTE — Progress Notes (Signed)
Subjective:     Patient ID: Vanessa Shannon, female   DOB: October 28, 1975, 44 y.o.   MRN: [id number]  HPI [name] presents to the employee health clinic today for her required wellness visit for her insurance. She would also like her lungs listened to after recovering from Covid-19 infection. She was diagnosed with Covid-19 on 1/13, and has since recovered well. Denies any residual symptoms. She received the first covid-19 vaccine on Monday and had some fatigue and body aches afterwards. She is feeling well today. Her PCP is Dr. Selena Batten at [facility], though she said Dr. Selena Batten left the practice recently so she will see someone else at that practice. She reports PMH of HTN but was taken off of meds d/t BP improving without them. She is wanting to get back into walking for exercise. She has never had a mammogram. Her pap smear is UTD.   Past Medical History:  Diagnosis Date  . Anemia 2002   postop hemorrhage  . Blood transfusion without reported diagnosis    uterine laceration with first CS post cesarean section  . GERD (gastroesophageal reflux disease)   . Headache   . Heartburn during pregnancy   . History of 3 cesarean sections 03/23/2016   P1 in 2002: pushed 3 hours before C/S, had cervical tear and PPH P2 in 2016: scheduled repeat LTCS   No Known Allergies  Current Outpatient Medications:  .  acetaminophen (TYLENOL) 500 MG tablet, Take 500 mg by mouth every 6 (six) hours as needed., Disp: , Rfl:     Review of Systems  Constitutional: Negative for chills, fatigue, fever and unexpected weight change.  HENT: Negative for congestion, ear pain, sinus pressure, sinus pain and sore throat.   Eyes: Negative for discharge and visual disturbance.  Respiratory: Negative for cough, shortness of breath and wheezing.   Cardiovascular: Negative for chest pain and leg swelling.  Gastrointestinal: Negative for abdominal pain, blood in stool, constipation, diarrhea, nausea and vomiting.   Genitourinary: Negative for difficulty urinating and hematuria.  Skin: Negative for color change.  Neurological: Negative for dizziness, weakness, light-headedness and headaches.  Hematological: Negative for adenopathy.  All other systems reviewed and are negative.      Objective:   Physical Exam Vitals and nursing note reviewed.  Constitutional:      General: She is not in acute distress.    Appearance: Normal appearance. She is well-developed. She is not toxic-appearing.  HENT:     Head: Normocephalic and atraumatic.  Eyes:     General:        Right eye: No discharge.        Left eye: No discharge.  Cardiovascular:     Rate and Rhythm: Normal rate and regular rhythm.     Heart sounds: Normal heart sounds. No murmur.  Pulmonary:     Effort: Pulmonary effort is normal. No respiratory distress.     Breath sounds: Normal breath sounds.  Musculoskeletal:     Cervical back: Neck supple.  Skin:    General: Skin is warm and dry.  Neurological:     Mental Status: She is alert and oriented to person, place, and time.  Psychiatric:        Mood and Affect: Mood normal.        Behavior: Behavior normal.    Today's Vitals   06/21/19 1148  BP: (!) 142/98  Pulse: 92  SpO2: 99%   There is no height or weight on file to calculate BMI.  Assessment:     1. Participant in health and wellness plan Encouraged continued healthy diet and exercise for weight loss. Recommend mammogram at her age, scheduled her for on-site mobile mammogram unit on 3/25.   2. Elevated blood pressure reading BP elevated today. She will have BP monitored at work and if continues to be 140/90 or higher she will schedule f/u in the next few weeks to discuss resuming BP medication. DASH diet encouraged.      Plan:     See above

## 2019-10-13 ENCOUNTER — Encounter (HOSPITAL_COMMUNITY): Payer: Self-pay | Admitting: Emergency Medicine

## 2019-10-13 ENCOUNTER — Other Ambulatory Visit: Payer: Self-pay

## 2019-10-13 ENCOUNTER — Ambulatory Visit (HOSPITAL_COMMUNITY)
Admission: EM | Admit: 2019-10-13 | Discharge: 2019-10-13 | Disposition: A | Discharge status: Home / Self Care | Payer: Medicaid Other | Attending: Emergency Medicine | Admitting: Emergency Medicine

## 2019-10-13 DIAGNOSIS — L720 Epidermal cyst: Secondary | ICD-10-CM

## 2019-10-13 DIAGNOSIS — M79602 Pain in left arm: Secondary | ICD-10-CM

## 2019-10-13 MED ORDER — NAPROXEN 500 MG PO TABS: 500.0000 mg | ORAL_TABLET | Freq: Two times a day (BID) | ORAL | 0 refills | Status: DC

## 2019-10-13 NOTE — ED Triage Notes (Signed)
Pt has 3 abscess on her left arm x 1 week. Pt states they are very painful and growing in size. Pt states she has started having some numbness in that arm.

## 2019-10-13 NOTE — Discharge Instructions (Addendum)
Use anti-inflammatories for pain/swelling. You may take up to 800 mg Ibuprofen every 8 hours with food. You may supplement Ibuprofen with Tylenol [phone] mg every 8 hours.  OR naprosyn twice daily with food Warm compresses  Follow up with primary care or sports medicine for further evaluation of these areas if persisting or worsening, more swollen or affecting ability to use arm

## 2019-10-14 NOTE — ED Provider Notes (Signed)
[facility]    CSN: [id number] Arrival date & time: 10/13/19  1649      History   Chief Complaint Chief Complaint  Patient presents with  . Abscess    HPI Vanessa Shannon is a 44 y.o. female presenting today for evaluation of areas of swelling to her left arm.  Patient notes that she has had 3 areas that have become large and slightly irritated over the past week.  Recently they have become painful with certain movements of her arm as well as occasional numbness.  Denies redness.  Denies history of similar.  Denies any injury trauma or fall.  HPI  Past Medical History:  Diagnosis Date  . Anemia 2002   postop hemorrhage  . Blood transfusion without reported diagnosis    uterine laceration with first CS post cesarean section  . GERD (gastroesophageal reflux disease)   . Headache   . Heartburn during pregnancy   . History of 3 cesarean sections 03/23/2016   P1 in 2002: pushed 3 hours before C/S, had cervical tear and PPH P2 in 2016: scheduled repeat LTCS    Patient Active Problem List   Diagnosis Date Noted  . Lactating mother 01/20/2017  . Essential hypertension 01/20/2017  . Allergic rhinitis 08/09/2013    Past Surgical History:  Procedure Laterality Date  . CESAREAN SECTION    . CESAREAN SECTION N/A 11/19/2014   Procedure: CESAREAN SECTION;  Surgeon: Lavina Hamman, MD;  Location: [facility];  Service: Obstetrics;  Laterality: N/A;  . CESAREAN SECTION WITH BILATERAL TUBAL LIGATION Bilateral 04/24/2016   Procedure: CESAREAN SECTION WITH BILATERAL TUBAL LIGATION;  Surgeon: Willodean Rosenthal, MD;  Location: [facility];  Service: Obstetrics;  Laterality: Bilateral;    OB History    Gravida  5   Para  3   Term  3   Preterm      AB  2   Living  3     SAB  1   TAB      Ectopic      Multiple  0   Live Births  3            Home Medications    Prior to Admission medications   Medication Sig Start Date End Date Taking? Authorizing  Provider  acetaminophen (TYLENOL) 500 MG tablet Take 500 mg by mouth every 6 (six) hours as needed.    [provider]  naproxen (NAPROSYN) 500 MG tablet Take 1 tablet (500 mg total) by mouth 2 (two) times daily. 10/13/19   Akoni Parton, Junius Creamer, PA-C    Family History Family History  Problem Relation Age of Onset  . Lupus Mother   . Hypertension Mother   . Cancer Father        brain cancer  . Stroke Maternal Grandmother   . Hypertension Maternal Grandmother   . Stroke Paternal Grandfather     Social History Social History   Tobacco Use  . Smoking status: Never Smoker  . Smokeless tobacco: Never Used  Substance Use Topics  . Alcohol use: Yes    Comment: occ/social   . Drug use: No     Allergies   Patient has no known allergies.   Review of Systems Review of Systems  Constitutional: Negative for fatigue and fever.  Eyes: Negative for visual disturbance.  Respiratory: Negative for shortness of breath.   Cardiovascular: Negative for chest pain.  Gastrointestinal: Negative for abdominal pain, nausea and vomiting.  Musculoskeletal: Negative for arthralgias  and joint swelling.  Skin: Negative for color change, rash and wound.  Neurological: Negative for dizziness, weakness, light-headedness and headaches.     Physical Exam Triage Vital Signs ED Triage Vitals  Enc Vitals Group     BP 10/13/19 1724 (!) 167/97     Pulse Rate 10/13/19 1722 84     Resp 10/13/19 1722 14     Temp 10/13/19 1722 98.4 F (36.9 C)     Temp Source 10/13/19 1722 Oral     SpO2 10/13/19 1722 100 %     Weight --      Height --      Head Circumference --      Peak Flow --      Pain Score 10/13/19 1724 4     Pain Loc --      Pain Edu? --      Excl. in [place]? --    No data found.  Updated Vital Signs BP (!) 167/97 (BP Location: Right Arm)   Pulse 84   Temp 98.4 F (36.9 C) (Oral)   Resp 14   LMP 10/11/2019   SpO2 100%   Visual Acuity Right Eye Distance:   Left Eye Distance:     Bilateral Distance:    Right Eye Near:   Left Eye Near:    Bilateral Near:     Physical Exam Vitals and nursing note reviewed.  Constitutional:      Appearance: She is well-developed.     Comments: No acute distress  HENT:     Head: Normocephalic and atraumatic.     Nose: Nose normal.  Eyes:     Conjunctiva/sclera: Conjunctivae normal.  Cardiovascular:     Rate and Rhythm: Normal rate.  Pulmonary:     Effort: Pulmonary effort is normal. No respiratory distress.  Abdominal:     General: There is no distension.  Musculoskeletal:        General: Normal range of motion.     Cervical back: Neck supple.     Comments: Full active range of motion at shoulder elbow and wrist, strength 5/5 and equal bilaterally  Skin:    General: Skin is warm and dry.     Comments: Left arm with mild loss just above the antecubital fossa, small palpable circular area of swelling within medial aspect of upper arm and distal forearm, no overlying erythema or swelling, tenderness to palpation  Neurological:     Mental Status: She is alert and oriented to person, place, and time.      UC Treatments / Results  Labs (all labs ordered are listed, but only abnormal results are displayed) Labs Reviewed - No data to display  EKG   Radiology No results found.  Procedures Procedures (including critical care time)  Medications Ordered in UC Medications - No data to display  Initial Impression / Assessment and Plan / UC Course  I have reviewed the triage vital signs and the nursing notes.  Pertinent labs & imaging results that were available during my care of the patient were reviewed by me and considered in my medical decision making (see chart for details).     lesions not suggestive of abscess, possible underlying cyst.  Unclear etiology at this time, recommended warm compresses and anti-inflammatories with close monitoring.  If continuing to enlarge may need ultrasound for further evaluation  if continuing to limit ability to use left arm.  Discussed strict return precautions. Patient verbalized understanding and is agreeable with plan.  Final  Clinical Impressions(s) / UC Diagnoses   Final diagnoses:  Left arm pain  Cyst of skin and subcutaneous tissue     Discharge Instructions     Use anti-inflammatories for pain/swelling. You may take up to 800 mg Ibuprofen every 8 hours with food. You may supplement Ibuprofen with Tylenol [id number] mg every 8 hours.  OR naprosyn twice daily with food Warm compresses  Follow up with primary care or sports medicine for further evaluation of these areas if persisting or worsening, more swollen or affecting ability to use arm   ED Prescriptions    Medication Sig Dispense Auth. Provider   naproxen (NAPROSYN) 500 MG tablet Take 1 tablet (500 mg total) by mouth 2 (two) times daily. 30 tablet [place] Holian, [place] C, PA-C     PDMP not reviewed this encounter.   Lew Dawes, PA-C 10/14/19 1039

## 2020-05-08 ENCOUNTER — Ambulatory Visit: Payer: Medicaid Other | Admitting: Family Medicine

## 2020-05-08 VITALS — BP 164/102 | HR 88

## 2020-05-08 DIAGNOSIS — I10 Essential (primary) hypertension: Secondary | ICD-10-CM

## 2020-05-08 MED ORDER — AMLODIPINE BESYLATE 5 MG PO TABS: 5.0000 mg | ORAL_TABLET | Freq: Every day | ORAL | 1 refills | Status: DC

## 2020-05-08 NOTE — Progress Notes (Signed)
Subjective:     Patient ID: Vanessa Shannon, female   DOB: 06-13-1975, 44 y.o.   MRN: [id number]  HPI  Vanessa Shannon [facility] the [facility] today because of high blood pressure. She states her home BP readings have consistently been around 140s/90s. She recently went to the dentist for a wisdom tooth extraction but they refused to extract the tooth d/t high BP in office. She states BP in their office was 150/102. She denies any h/a, vision changes, chest pain, lower extremity swelling. She does report over the last week or so has noticed some palpitations. She states it feels like a "hard beat," like it's skipping a beat. States it only lasts for 1-2 beats and occurs maybe once a day randomly. When it occurs she is asymptomatic. She does report increased caffeine intake, drinks 2-3 shots of espresso daily, states she used to not drink any caffeine. No new medications - has taken tylenol and ibuprofen for her wisdom tooth pain and finished a course of amoxicillin for this as well over a week ago.   Strong family hx of HTN. Also personal hx of HTN at end of each pregnancy, but would resolve 4-5 months postpartum. Her youngest is now 4 and BP has remained high. She was on propranolol during her pregnancies.   Past Medical History:  Diagnosis Date  . Anemia 2002   postop hemorrhage  . Blood transfusion without reported diagnosis    uterine laceration with first CS post cesarean section  . GERD (gastroesophageal reflux disease)   . Headache   . Heartburn during pregnancy   . History of 3 cesarean sections 03/23/2016   P1 in 2002: pushed 3 hours before C/S, had cervical tear and PPH P2 in 2016: scheduled repeat LTCS   No Known Allergies  Current Outpatient Medications:  .  acetaminophen (TYLENOL) 500 MG tablet, Take 500 mg by mouth every 6 (six) hours as needed., Disp: , Rfl:  .  amLODipine (NORVASC) 5 MG tablet, Take 1 tablet (5 mg total) by mouth daily., Disp: 30 tablet, Rfl: 1 .  naproxen  (NAPROSYN) 500 MG tablet, Take 1 tablet (500 mg total) by mouth 2 (two) times daily., Disp: 30 tablet, Rfl: 0    Review of Systems  Constitutional: Negative for activity change, appetite change, chills, fatigue, fever and unexpected weight change.  HENT: Negative.   Eyes: Negative for photophobia, pain and visual disturbance.  Respiratory: Negative for cough, chest tightness, shortness of breath and wheezing.   Cardiovascular: Positive for palpitations (See HPI). Negative for chest pain and leg swelling.  Gastrointestinal: Negative.   Neurological: Negative for dizziness, speech difficulty, weakness, light-headedness and headaches.       Objective:   Physical Exam Vitals reviewed.  Constitutional:      General: She is not in acute distress.    Appearance: Normal appearance. She is well-developed.  HENT:     Head: Normocephalic and atraumatic.  Eyes:     General:        Right eye: No discharge.        Left eye: No discharge.  Cardiovascular:     Rate and Rhythm: Normal rate and regular rhythm.     Pulses: Normal pulses.     Heart sounds: Normal heart sounds.  Pulmonary:     Effort: Pulmonary effort is normal. No respiratory distress.     Breath sounds: Normal breath sounds.  Musculoskeletal:     Cervical back: Neck supple.  Right lower leg: No edema.     Left lower leg: No edema.  Skin:    General: Skin is warm and dry.  Neurological:     Mental Status: She is alert and oriented to person, place, and time.  Psychiatric:        Mood and Affect: Mood normal.        Behavior: Behavior normal.    Today's Vitals   05/08/20 1035  BP: (!) 164/102  Pulse: 88  SpO2: 98%   There is no height or weight on file to calculate BMI.     Assessment:     Essential hypertension      Plan:     1. Pt with continued elevated BP. Recommend pharmacologic treatment in combination with lifestyle changes. Will start on amlodipine 5 mg daily. She will notify if any problems. She  will monitor her BP at home and keep a log. Encouraged her to schedule appt with PCP to take over management of this.  2. Will need lab work done. She will come fasting next week. Will check lipid panel, CBC, and CMET. Will also consider A1c given family hx of T2DM.  3. Encouraged DASH diet and increased physical activity.  4. F/u here next week for lab work. Notify if any problems. 5. Discussed concerns with palpitations. Not appreciated today during auscultation. Recommend f/u with PCP for EKG and referral to cardiology for further evaluation. Should the palpitations become more frequent or symptomatic she should seek care at ED.

## 2020-05-15 ENCOUNTER — Other Ambulatory Visit: Payer: Self-pay | Admitting: Family Medicine

## 2020-05-15 ENCOUNTER — Ambulatory Visit: Payer: Medicaid Other | Admitting: Family Medicine

## 2020-05-15 VITALS — BP 122/88

## 2020-05-15 DIAGNOSIS — I10 Essential (primary) hypertension: Secondary | ICD-10-CM

## 2020-05-15 NOTE — Progress Notes (Signed)
  Subjective:     Patient ID: Vanessa Shannon, female   DOB: Jun 03, 1975, 44 y.o.   MRN: [id number]  HPI Vanessa Shannon presents to the [facility] clinic today for recheck of her BP and lab draw. She has been compliant with taking her amlodipine daily and denies any adverse effects. She reports home BPs have been in the 120s over 80s. She is working on getting set up with a new PCP.   Review of Systems  All other systems reviewed and are negative.      Objective:   Physical Exam Vitals reviewed.  Constitutional:      General: She is not in acute distress.    Appearance: Normal appearance. She is well-developed.  HENT:     Head: Normocephalic and atraumatic.  Eyes:     General:        Right eye: No discharge.        Left eye: No discharge.  Pulmonary:     Effort: Pulmonary effort is normal. No respiratory distress.  Skin:    General: Skin is warm and dry.  Neurological:     Mental Status: She is alert and oriented to person, place, and time.  Psychiatric:        Mood and Affect: Mood normal.        Behavior: Behavior normal.    Today's Vitals   05/15/20 0821  BP: 122/88   There is no height or weight on file to calculate BMI.     Assessment:     Essential hypertension      Plan:     1. Continue amlodipine at current dose and continue monitoring BP at home and keep a log. Get appt scheduled with new PCP to take over management of HTN. F/u here in 1 month if unable to get appt by then.  2. Lab work drawn today, will check fasting lipid, CMP, CBC, and A1c. Will notify pt of results when available.

## 2020-05-16 LAB — LIPID PANEL
Cholesterol: 218 mg/dL — ABNORMAL HIGH (ref <200)
HDL: 72 mg/dL (ref >=50)
LDL Cholesterol (Calc): 131 mg/dL (calc) — ABNORMAL HIGH
Non-HDL Cholesterol (Calc): 146 mg/dL (calc) — ABNORMAL HIGH (ref <130)
Total CHOL/HDL Ratio: 3 (calc) (ref <5.0)
Triglycerides: 62 mg/dL (ref <150)

## 2020-05-16 LAB — HEMOGLOBIN A1C W/OUT EAG: Hgb A1c MFr Bld: 4.4 % of total Hgb (ref <5.7)

## 2020-05-16 LAB — COMPLETE METABOLIC PANEL WITH GFR
AG Ratio: 1.5 (calc) (ref 1.0–2.5)
ALT: 12 U/L (ref 6–29)
AST: 12 U/L (ref 10–35)
Albumin: 4.3 g/dL (ref 3.6–5.1)
Alkaline phosphatase (APISO): 55 U/L (ref 31–125)
BUN: 16 mg/dL (ref 7–25)
CO2: 23 mmol/L (ref 20–32)
Calcium: 9 mg/dL (ref 8.6–10.2)
Chloride: 105 mmol/L (ref 98–110)
Creat: 0.73 mg/dL (ref 0.50–1.10)
GFR, Est African American: 115 mL/min/{1.73_m2} (ref >=60)
GFR, Est Non African American: 99 mL/min/{1.73_m2} (ref >=60)
Globulin: 2.9 g/dL (calc) (ref 1.9–3.7)
Glucose, Bld: 93 mg/dL (ref 65–99)
Potassium: 3.8 mmol/L (ref 3.5–5.3)
Sodium: 138 mmol/L (ref 135–146)
Total Bilirubin: 0.6 mg/dL (ref 0.2–1.2)
Total Protein: 7.2 g/dL (ref 6.1–8.1)

## 2020-05-16 LAB — CBC
HCT: 40 % (ref 35.0–45.0)
Hemoglobin: 13.4 g/dL (ref 11.7–15.5)
MCH: 32.1 pg (ref 27.0–33.0)
MCHC: 33.5 g/dL (ref 32.0–36.0)
MCV: 95.9 fL (ref 80.0–100.0)
MPV: 12.2 fL (ref 7.5–12.5)
Platelets: 249 10*3/uL (ref 140–400)
RBC: 4.17 10*6/uL (ref 3.80–5.10)
RDW: 10.7 % — ABNORMAL LOW (ref 11.0–15.0)
WBC: 6.3 10*3/uL (ref 3.8–10.8)

## 2020-06-12 ENCOUNTER — Other Ambulatory Visit: Payer: Self-pay | Admitting: Family Medicine

## 2020-06-12 MED ORDER — AMLODIPINE BESYLATE 5 MG PO TABS: 5.0000 mg | ORAL_TABLET | Freq: Every day | ORAL | 0 refills | Status: DC

## 2020-08-25 ENCOUNTER — Other Ambulatory Visit: Payer: Self-pay | Admitting: Family Medicine

## 2020-08-25 DIAGNOSIS — Z1231 Encounter for screening mammogram for malignant neoplasm of breast: Secondary | ICD-10-CM

## 2020-12-25 ENCOUNTER — Other Ambulatory Visit: Payer: Self-pay | Admitting: Family Medicine

## 2020-12-25 MED ORDER — AMLODIPINE BESYLATE 5 MG PO TABS: 5.0000 mg | ORAL_TABLET | Freq: Every day | ORAL | 0 refills | Status: DC

## 2021-01-01 ENCOUNTER — Ambulatory Visit: Payer: Medicaid Other | Admitting: Family Medicine

## 2021-01-01 ENCOUNTER — Encounter: Payer: Self-pay | Admitting: Family Medicine

## 2021-01-01 VITALS — BP 112/90 | HR 80

## 2021-01-01 DIAGNOSIS — I10 Essential (primary) hypertension: Secondary | ICD-10-CM

## 2021-01-01 MED ORDER — CHLORTHALIDONE 25 MG PO TABS: 12.5000 mg | ORAL_TABLET | Freq: Every day | ORAL | 1 refills | Status: DC

## 2021-01-01 NOTE — Progress Notes (Signed)
Subjective:     Patient ID: Vanessa Shannon, female   DOB: 11/06/1975, 45 y.o.   MRN: [id number]  HPI Vanessa Shannon presents to the [facility] and wellness clinic today for follow up of her HTN. She is compliant with her medication and is working on healthy eating and weight loss. She denies any adverse effects. Denies chest pain, h/a, vision changes, shortness of breath, or syncope. She reports home BP monitoring with systolic in the 110-120s and diastolic in the 90s. She denies any problems or concerns today.   Past Medical History:  Diagnosis Date   Anemia 2002   postop hemorrhage   Blood transfusion without reported diagnosis    uterine laceration with first CS post cesarean section   GERD (gastroesophageal reflux disease)    Headache    Heartburn during pregnancy    History of 3 cesarean sections 03/23/2016   P1 in 2002: pushed 3 hours before C/S, had cervical tear and PPH P2 in 2016: scheduled repeat LTCS   No Known Allergies  Current Outpatient Medications:    chlorthalidone (HYGROTON) 25 MG tablet, Take 0.5 tablets (12.5 mg total) by mouth daily., Disp: 30 tablet, Rfl: 1   Multiple Vitamin (MULTIVITAMIN) tablet, Take 1 tablet by mouth daily., Disp: , Rfl:    Omega-3 Fatty Acids (FISH OIL OMEGA-3 PO), Take by mouth., Disp: , Rfl:    acetaminophen (TYLENOL) 500 MG tablet, Take 500 mg by mouth every 6 (six) hours as needed., Disp: , Rfl:    naproxen (NAPROSYN) 500 MG tablet, Take 1 tablet (500 mg total) by mouth 2 (two) times daily., Disp: 30 tablet, Rfl: 0  Review of Systems  Constitutional:  Negative for chills, fatigue, fever and unexpected weight change.  HENT:  Negative for congestion, ear pain, sinus pressure, sinus pain and sore throat.   Eyes:  Negative for discharge and visual disturbance.  Respiratory:  Negative for cough, shortness of breath and wheezing.   Cardiovascular:  Negative for chest pain and leg swelling.  Gastrointestinal:  Negative for abdominal pain, blood in  stool, constipation, diarrhea, nausea and vomiting.  Genitourinary:  Negative for difficulty urinating and hematuria.  Skin:  Negative for color change.  Neurological:  Negative for dizziness, weakness, light-headedness and headaches.  Hematological:  Negative for adenopathy.  All other systems reviewed and are negative.     Objective:   Physical Exam Vitals reviewed.  Constitutional:      General: She is not in acute distress.    Appearance: Normal appearance. She is well-developed.  HENT:     Head: Normocephalic and atraumatic.  Eyes:     General:        Right eye: No discharge.        Left eye: No discharge.  Cardiovascular:     Rate and Rhythm: Normal rate and regular rhythm.     Heart sounds: Normal heart sounds.  Pulmonary:     Effort: Pulmonary effort is normal. No respiratory distress.     Breath sounds: Normal breath sounds.  Musculoskeletal:     Cervical back: Neck supple.  Skin:    General: Skin is warm and dry.  Neurological:     Mental Status: She is alert and oriented to person, place, and time.  Psychiatric:        Mood and Affect: Mood normal.        Behavior: Behavior normal.  Today's Vitals   01/01/21 1133  BP: 112/90  Pulse: 80  SpO2: 99%  There is no height or weight on file to calculate BMI.      Assessment:     Essential hypertension     Plan:     Systolic BP under good control, however diastolic is not at goal and remains consistently elevated per pt home monitoring. Recommend trial of different class of BP medication in place of the amlodipine. She will stop the amlodipine and start chlorthalidone 12.5 mg daily. She will keep a log of her BP at home to bring at next visit. F/u in 3 weeks for BP recheck and adjust treatment if necessary. If BP still not at goal at that time may increase chlorthalidone or add back amlodipine. Encouraged DASH diet and exercise. Will repeat CMP at next visit to monitor electrolytes and renal function.

## 2021-01-22 ENCOUNTER — Ambulatory Visit: Payer: Medicaid Other

## 2021-02-05 ENCOUNTER — Other Ambulatory Visit: Payer: Self-pay | Admitting: Family Medicine

## 2021-02-05 ENCOUNTER — Ambulatory Visit: Payer: Medicaid Other | Admitting: Family Medicine

## 2021-02-05 VITALS — BP 120/88 | HR 80

## 2021-02-05 DIAGNOSIS — I10 Essential (primary) hypertension: Secondary | ICD-10-CM

## 2021-02-05 LAB — COMPLETE METABOLIC PANEL WITH GFR
AG Ratio: 1.4 (calc) (ref 1.0–2.5)
ALT: 15 U/L (ref 6–29)
AST: 15 U/L (ref 10–35)
Albumin: 4.4 g/dL (ref 3.6–5.1)
Alkaline phosphatase (APISO): 49 U/L (ref 31–125)
BUN: 13 mg/dL (ref 7–25)
CO2: 26 mmol/L (ref 20–32)
Calcium: 9.1 mg/dL (ref 8.6–10.2)
Chloride: 100 mmol/L (ref 98–110)
Creat: 0.71 mg/dL (ref 0.50–0.99)
Globulin: 3.1 g/dL (calc) (ref 1.9–3.7)
Glucose, Bld: 85 mg/dL (ref 65–139)
Potassium: 3.5 mmol/L (ref 3.5–5.3)
Sodium: 137 mmol/L (ref 135–146)
Total Bilirubin: 0.7 mg/dL (ref 0.2–1.2)
Total Protein: 7.5 g/dL (ref 6.1–8.1)
eGFR: 106 mL/min/{1.73_m2} (ref >=60)

## 2021-02-05 LAB — CBC
HCT: 36.7 % (ref 35.0–45.0)
Hemoglobin: 12.2 g/dL (ref 11.7–15.5)
MCH: 32 pg (ref 27.0–33.0)
MCHC: 33.2 g/dL (ref 32.0–36.0)
MCV: 96.3 fL (ref 80.0–100.0)
MPV: 11.6 fL (ref 7.5–12.5)
Platelets: 210 10*3/uL (ref 140–400)
RBC: 3.81 10*6/uL (ref 3.80–5.10)
RDW: 11.3 % (ref 11.0–15.0)
WBC: 5.3 10*3/uL (ref 3.8–10.8)

## 2021-02-05 LAB — TSH: TSH: 0.65 mIU/L

## 2021-02-05 NOTE — Progress Notes (Signed)
Subjective:     Patient ID: Vanessa Shannon, female   DOB: Feb 04, 1976, 45 y.o.   MRN: [id number]  HPI Vanessa Shannon presents to the [facility] and wellness clinic today for f/u of her HTN. She started on chlorthalidone and is tolerating well, she denies any adverse effects. States her BP at home has been in the 120s over mid to upper 80s. This is an improvement of her diastolic readings from last time.   Past Medical History:  Diagnosis Date   Anemia 2002   postop hemorrhage   Blood transfusion without reported diagnosis    uterine laceration with first CS post cesarean section   GERD (gastroesophageal reflux disease)    Headache    Heartburn during pregnancy    History of 3 cesarean sections 03/23/2016   P1 in 2002: pushed 3 hours before C/S, had cervical tear and PPH P2 in 2016: scheduled repeat LTCS   No Known Allergies  Current Outpatient Medications:    acetaminophen (TYLENOL) 500 MG tablet, Take 500 mg by mouth every 6 (six) hours as needed., Disp: , Rfl:    chlorthalidone (HYGROTON) 25 MG tablet, Take 0.5 tablets (12.5 mg total) by mouth daily., Disp: 30 tablet, Rfl: 1   Multiple Vitamin (MULTIVITAMIN) tablet, Take 1 tablet by mouth daily., Disp: , Rfl:    naproxen (NAPROSYN) 500 MG tablet, Take 1 tablet (500 mg total) by mouth 2 (two) times daily., Disp: 30 tablet, Rfl: 0   Omega-3 Fatty Acids (FISH OIL OMEGA-3 PO), Take by mouth., Disp: , Rfl:    Review of Systems  Constitutional:  Negative for chills, fatigue, fever and unexpected weight change.  HENT:  Negative for congestion, ear pain, sinus pressure, sinus pain and sore throat.   Eyes:  Negative for discharge and visual disturbance.  Respiratory:  Negative for cough, shortness of breath and wheezing.   Cardiovascular:  Negative for chest pain and leg swelling.  Gastrointestinal:  Negative for abdominal pain, blood in stool, constipation, diarrhea, nausea and vomiting.  Genitourinary:  Negative for difficulty urinating and  hematuria.  Skin:  Negative for color change.  Neurological:  Negative for dizziness, weakness, light-headedness and headaches.  Hematological:  Negative for adenopathy.  All other systems reviewed and are negative.     Objective:   Physical Exam Vitals reviewed.  Constitutional:      General: She is not in acute distress.    Appearance: Normal appearance. She is well-developed.  HENT:     Head: Normocephalic and atraumatic.  Eyes:     General:        Right eye: No discharge.        Left eye: No discharge.  Cardiovascular:     Rate and Rhythm: Normal rate and regular rhythm.     Heart sounds: Normal heart sounds.  Pulmonary:     Effort: Pulmonary effort is normal. No respiratory distress.     Breath sounds: Normal breath sounds.  Musculoskeletal:     Cervical back: Neck supple.  Skin:    General: Skin is warm and dry.  Neurological:     Mental Status: She is alert and oriented to person, place, and time.  Psychiatric:        Mood and Affect: Mood normal.        Behavior: Behavior normal.   Today's Vitals   02/05/21 1110  BP: 120/88  Pulse: 80  SpO2: 98%   There is no height or weight on file to calculate BMI.  Assessment:     Essential hypertension     Plan:     Continue with chlorthalidone at current dose. Check BPs at home and notify if diastolic consistently 90 or higher. Will check CMP today.  Pt also reports some fatigue that has been ongoing, will check CBC and TSH today as well. Stressed the importance of getting a PCP to manage her HTN.

## 2021-05-07 ENCOUNTER — Encounter: Payer: Self-pay | Admitting: Family

## 2021-05-07 ENCOUNTER — Other Ambulatory Visit: Payer: Self-pay

## 2021-05-07 ENCOUNTER — Ambulatory Visit: Payer: Medicaid Other | Admitting: Family

## 2021-05-07 VITALS — BP 160/92 | HR 88 | Temp 97.4°F | Resp 16

## 2021-05-07 DIAGNOSIS — I10 Essential (primary) hypertension: Secondary | ICD-10-CM

## 2021-05-07 MED ORDER — AMLODIPINE BESYLATE 5 MG PO TABS: 5.0000 mg | ORAL_TABLET | Freq: Every day | ORAL | 2 refills | Status: DC

## 2021-05-07 MED ORDER — CHLORTHALIDONE 25 MG PO TABS: 12.5000 mg | ORAL_TABLET | Freq: Every day | ORAL | 2 refills | Status: DC

## 2021-05-07 NOTE — Progress Notes (Signed)
Subjective:     Patient ID: Vanessa Shannon, female   DOB: 10/01/1975, 45 y.o.   MRN: [id number]  Hypertension Pertinent negatives include no chest pain, headaches or shortness of breath.  Vanessa Shannon presents to the [facility] and wellness clinic today for f/u of her HTN. Vanessa Shannon started on amlodipine and is tolerating well, Vanessa Shannon denies any adverse effects. States her BP at home has been in the 120s over mid to upper 90s.   Vanessa Shannon was previously treated with amlodipine and chlorthalidone - and had better control of her BP.   Vanessa Shannon stated that Vanessa Shannon started to noticed near sightedness is the last few months.  Past Medical History:  Diagnosis Date   Anemia 2002   postop hemorrhage   Blood transfusion without reported diagnosis    uterine laceration with first CS post cesarean section   GERD (gastroesophageal reflux disease)    Headache    Heartburn during pregnancy    History of 3 cesarean sections 03/23/2016   P1 in 2002: pushed 3 hours before C/S, had cervical tear and PPH P2 in 2016: scheduled repeat LTCS   No Known Allergies  Current Outpatient Medications:    amLODipine (NORVASC) 5 MG tablet, Take 1 tablet (5 mg total) by mouth daily., Disp: 90 tablet, Rfl: 2   acetaminophen (TYLENOL) 500 MG tablet, Take 500 mg by mouth every 6 (six) hours as needed., Disp: , Rfl:    chlorthalidone (HYGROTON) 25 MG tablet, Take 0.5 tablets (12.5 mg total) by mouth daily., Disp: 30 tablet, Rfl: 2   Multiple Vitamin (MULTIVITAMIN) tablet, Take 1 tablet by mouth daily., Disp: , Rfl:    naproxen (NAPROSYN) 500 MG tablet, Take 1 tablet (500 mg total) by mouth 2 (two) times daily., Disp: 30 tablet, Rfl: 0   Omega-3 Fatty Acids (FISH OIL OMEGA-3 PO), Take by mouth., Disp: , Rfl:    Review of Systems  Constitutional:  Negative for chills, fatigue, fever and unexpected weight change.  HENT:  Negative for congestion, ear pain, sinus pressure, sinus pain and sore throat.   Eyes:  Negative for discharge and visual  disturbance.  Respiratory:  Negative for cough, shortness of breath and wheezing.   Cardiovascular:  Negative for chest pain and leg swelling.  Gastrointestinal:  Negative for abdominal pain, blood in stool, constipation, diarrhea, nausea and vomiting.  Genitourinary:  Negative for difficulty urinating and hematuria.  Skin:  Negative for color change.  Neurological:  Negative for dizziness, weakness, light-headedness and headaches.  Hematological:  Negative for adenopathy.  All other systems reviewed and are negative.     Objective:   Physical Exam Vitals reviewed.  Constitutional:      General: Vanessa Shannon is not in acute distress.    Appearance: Normal appearance. Vanessa Shannon is well-developed.  HENT:     Head: Normocephalic and atraumatic.  Eyes:     General:        Right eye: No discharge.        Left eye: No discharge.     Pupils: Pupils are equal, round, and reactive to light.  Cardiovascular:     Rate and Rhythm: Normal rate and regular rhythm.     Heart sounds: Normal heart sounds.  Pulmonary:     Effort: Pulmonary effort is normal. No respiratory distress.     Breath sounds: Normal breath sounds.  Musculoskeletal:     Cervical back: Neck supple.  Skin:    General: Skin is warm and dry.  Neurological:  Mental Status: Vanessa Shannon is alert and oriented to person, place, and time.  Psychiatric:        Mood and Affect: Mood normal.        Behavior: Behavior normal.   Today's Vitals   05/07/21 1419  BP: (!) 160/92  Pulse: 88  Resp: 16  Temp: (!) 97.4 F (36.3 C)  SpO2: 98%   There is no height or weight on file to calculate BMI.     Assessment:     Essential hypertension - Plan: chlorthalidone (HYGROTON) 25 MG tablet, amLODipine (NORVASC) 5 MG tablet     Plan:     Add chlorthalidone to current regimen. Check BPs at home and notify if diastolic consistently 90 or higher.  Stressed the importance of getting a PCP to manage her HTN.   3. Make an appointment with an  optometrist for evaluation.   RTC in 1 week for medication follow-up.

## 2021-05-14 ENCOUNTER — Ambulatory Visit: Payer: Medicaid Other | Admitting: Nurse Practitioner

## 2021-05-14 ENCOUNTER — Encounter: Payer: Self-pay | Admitting: Nurse Practitioner

## 2021-05-14 ENCOUNTER — Other Ambulatory Visit: Payer: Self-pay

## 2021-05-14 VITALS — BP 116/88

## 2021-05-14 DIAGNOSIS — I10 Essential (primary) hypertension: Secondary | ICD-10-CM

## 2021-05-14 NOTE — Progress Notes (Signed)
Subjective:     Patient ID: Vanessa Shannon, female   DOB: 29-Jun-1975, 45 y.o.   MRN: [id number]  Hypertension Pertinent negatives include no chest pain, headaches or shortness of breath.  Vanessa Shannon presents to the employee health and wellness clinic today for 1 week f/u of her HTN. She restarted her combination medications for HTN last Thursday. Amlodipine 5 mg was added to her Chlorthalidone 12.5 mg regimen.  She brought in a log of her home BP's for the past 7 days. 12/23 BP of 111/97 to 12/28 BP of 114/88. Denies any new dizziness or side effects with medication regimen.   Past Medical History:  Diagnosis Date   Anemia 2002   postop hemorrhage   Blood transfusion without reported diagnosis    uterine laceration with first CS post cesarean section   GERD (gastroesophageal reflux disease)    Headache    Heartburn during pregnancy    History of 3 cesarean sections 03/23/2016   P1 in 2002: pushed 3 hours before C/S, had cervical tear and PPH P2 in 2016: scheduled repeat LTCS   No Known Allergies  Current Outpatient Medications:    acetaminophen (TYLENOL) 500 MG tablet, Take 500 mg by mouth every 6 (six) hours as needed., Disp: , Rfl:    amLODipine (NORVASC) 5 MG tablet, Take 1 tablet (5 mg total) by mouth daily., Disp: 90 tablet, Rfl: 2   chlorthalidone (HYGROTON) 25 MG tablet, Take 0.5 tablets (12.5 mg total) by mouth daily., Disp: 30 tablet, Rfl: 2   Multiple Vitamin (MULTIVITAMIN) tablet, Take 1 tablet by mouth daily., Disp: , Rfl:    naproxen (NAPROSYN) 500 MG tablet, Take 1 tablet (500 mg total) by mouth 2 (two) times daily., Disp: 30 tablet, Rfl: 0   Omega-3 Fatty Acids (FISH OIL OMEGA-3 PO), Take by mouth., Disp: , Rfl:    Review of Systems  Constitutional:  Negative for chills, fatigue, fever and unexpected weight change.  HENT:  Negative for congestion, ear pain, sinus pressure, sinus pain and sore throat.   Eyes:  Negative for discharge and visual disturbance.  Respiratory:   Negative for cough, shortness of breath and wheezing.   Cardiovascular:  Negative for chest pain and leg swelling.  Gastrointestinal:  Negative for abdominal pain, blood in stool, constipation, diarrhea, nausea and vomiting.  Genitourinary:  Negative for difficulty urinating and hematuria.  Skin:  Negative for color change.  Neurological:  Negative for dizziness, weakness, light-headedness and headaches.  Hematological:  Negative for adenopathy.  All other systems reviewed and are negative.     Objective:   Physical Exam Vitals reviewed.  Constitutional:      General: She is not in acute distress.    Appearance: Normal appearance. She is well-developed.  HENT:     Head: Normocephalic and atraumatic.  Eyes:     General:        Right eye: No discharge.        Left eye: No discharge.     Pupils: Pupils are equal, round, and reactive to light.  Cardiovascular:     Rate and Rhythm: Normal rate and regular rhythm.     Heart sounds: Normal heart sounds.  Pulmonary:     Effort: Pulmonary effort is normal. No respiratory distress.     Breath sounds: Normal breath sounds.  Musculoskeletal:     Cervical back: Neck supple.  Skin:    General: Skin is warm and dry.  Neurological:     Mental Status: She is alert  and oriented to person, place, and time.  Psychiatric:        Mood and Affect: Mood normal.        Behavior: Behavior normal.   Today's Vitals   05/14/21 1530  BP: 116/88   There is no height or weight on file to calculate BMI.     Assessment:     Essential hypertension  Blood pressures are more controlled with Chlorthalidone 12.5 mg once a day combination regimen with Amlodipine 5 mg once a day.     Plan:     Stressed the importance of getting a PCP to manage her HTN. Advised to RTC in January if unable to establish PCP to draw labs. Labs last drawn 02/05/2021  Discussed importance of well balanced, low sodium diet, along with regular exercise.   3. Make an  appointment with an optometrist for evaluation.   RTC in 1 month for medication follow up if not established with PCP or sooner if needed.

## 2021-05-21 ENCOUNTER — Ambulatory Visit: Payer: Medicaid Other | Admitting: Family

## 2021-07-22 ENCOUNTER — Encounter: Payer: Self-pay | Admitting: Family Medicine

## 2021-07-22 ENCOUNTER — Ambulatory Visit: Payer: No Typology Code available for payment source | Admitting: Family Medicine

## 2021-07-22 VITALS — BP 122/74 | HR 82 | Temp 98.5°F | Ht 65.0 in | Wt 193.2 lb

## 2021-07-22 DIAGNOSIS — Z1231 Encounter for screening mammogram for malignant neoplasm of breast: Secondary | ICD-10-CM

## 2021-07-22 DIAGNOSIS — Z1211 Encounter for screening for malignant neoplasm of colon: Secondary | ICD-10-CM

## 2021-07-22 DIAGNOSIS — Z114 Encounter for screening for human immunodeficiency virus [HIV]: Secondary | ICD-10-CM | POA: Diagnosis not present

## 2021-07-22 DIAGNOSIS — Z1159 Encounter for screening for other viral diseases: Secondary | ICD-10-CM | POA: Diagnosis not present

## 2021-07-22 DIAGNOSIS — I10 Essential (primary) hypertension: Secondary | ICD-10-CM | POA: Diagnosis not present

## 2021-07-22 NOTE — Patient Instructions (Addendum)
Welcome to [facility]! It was a pleasure meeting you today. ? ?As discussed, Please schedule a 6 month follow up visit today. ?Call [phone] for mammogram  ? ?PLEASE NOTE: ? ?If you had any LAB tests please let us know if you have not heard back within a few days. You may see your results on MyChart before we have a chance to review them but we will give you a call once they are reviewed by [place]. If we ordered any REFERRALS today, please let us know if you have not heard from their office within the next week.  ?Let us know through MyChart if you are needing REFILLS, or have your pharmacy send [place] the request. You can also use MyChart to communicate with me or any office staff. ? ?Please try these tips to maintain a healthy lifestyle: ? ?Eat most of your calories during the day when you are active. Eliminate processed foods including packaged sweets (pies, cakes, cookies), reduce intake of potatoes, white bread, white pasta, and white rice. Look for whole grain options, oat flour or almond flour. ? ?Each meal should contain half fruits/vegetables, one quarter protein, and one quarter carbs (no bigger than a computer mouse). ? ?Cut down on sweet beverages. This includes juice, soda, and sweet tea. Also watch fruit intake, though this is a healthier sweet option, it still contains natural sugar! Limit to 3 servings daily. ? ?Drink at least 1 glass of water with each meal and aim for at least 8 glasses per day ? ?Exercise at least 150 minutes every week.   ?

## 2021-07-22 NOTE — Progress Notes (Signed)
? ?New Patient Office Visit ? ?Subjective:  ?Patient ID: Vanessa Shannon, female    DOB: [birth year]  Age: 46 y.o. MRN: [id number] ? ?CC:  ?Chief Complaint  ?Patient presents with  ? Establish Care  ? Hypertension  ? ? ?HPI ?Vanessa Shannon presents for new pt ? HTN- on amlodipine, bp still up in Dec, so 1/2 tab chlorthalidone added. Bp's 120-130/85-92. No HA/dizziness/cp/palp/edema/cough/sob.  Some exercise-walks at work  ?Allergic rhinitis-occ meds ? ?Past Medical History:  ?Diagnosis Date  ? Allergy   ? seasonal  ? Anemia 2002  ? postop hemorrhage  ? Blood transfusion without reported diagnosis   ? uterine laceration with first CS post cesarean section  ? GERD (gastroesophageal reflux disease)   ? Headache   ? Heartburn during pregnancy   ? History of 3 cesarean sections 03/23/2016  ? P1 in 2002: pushed 3 hours before C/S, had cervical tear and PPH P2 in 2016: scheduled repeat LTCS  ? Hypertension   ? ? ?Past Surgical History:  ?Procedure Laterality Date  ? CESAREAN SECTION  2002  ? CESAREAN SECTION N/A 11/19/2014  ? Procedure: CESAREAN SECTION;  Surgeon: Lavina Hamman, MD;  Location: [facility];  Service: Obstetrics;  Laterality: N/A;  ? CESAREAN SECTION WITH BILATERAL TUBAL LIGATION Bilateral 04/24/2016  ? Procedure: CESAREAN SECTION WITH BILATERAL TUBAL LIGATION;  Surgeon: Willodean Rosenthal, MD;  Location: [facility];  Service: Obstetrics;  Laterality: Bilateral;  ? LAPAROSCOPY    ? emergency laproscopic surgery in 2002  ? ? ?Family History  ?Problem Relation Age of Onset  ? Hyperlipidemia Mother   ? Arthritis Mother   ? Lupus Mother   ? Hypertension Mother   ? Hypertension Father   ? Cancer Father   ?     brain cancer  ? Kidney disease Maternal Grandmother   ? Stroke Maternal Grandmother   ? Hypertension Maternal Grandmother   ? Kidney disease Paternal Grandmother   ? Kidney disease Paternal Grandfather   ? Stroke Paternal Grandfather   ? ? ?Social History  ? ?Socioeconomic History  ? Marital status:  Single  ?  Spouse name: Not on file  ? Number of children: 3  ? Years of education: 46  ? Highest education level: Bachelor's degree (e.g., BA, AB, BS)  ?Occupational History  ? Not on file  ?Tobacco Use  ? Smoking status: Never  ? Smokeless tobacco: Never  ?Vaping Use  ? Vaping Use: Never used  ?Substance and Sexual Activity  ? Alcohol use: Yes  ?  Comment: occ/social   ? Drug use: No  ? Sexual activity: Yes  ?  Birth control/protection: Surgical  ?Other Topics Concern  ? Not on file  ?Social History Narrative  ? 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  ? Home Situation: lives with daughter (55 yr old in 2014) and grandmotherSpiritual Beliefs: ChristianLifestyle: no regular exercise  ? ?Social Determinants of Health  ? ?Financial Resource Strain: Not on file  ?Food Insecurity: Not on file  ?Transportation Needs: Not on file  ?Physical Activity: Not on file  ?Stress: Not on file  ?Social Connections: Not on file  ?Intimate Partner Violence: Not on file  ? ? ?ROS  ?ROS: ?Gen: no fever, chills  ?Skin: no rash, itching ?ENT: no ear pain, ear drainage, nasal congestion, rhinorrhea, sinus pressure, sore throat ?Eyes: some blurry vision, no double vision ?Resp: no cough, wheeze,SOB ?Breast: no breast tenderness, no nipple discharge, no breast masses ?CV: no CP, palpitations, LE edema,  ?  GI: no heartburn, n/v/d/c, abd pain ?GU: no dysuria, urgency, frequency, hematuria.  LMP now-regular still.  Pap 2017.  Mamm 1991-cyst. ?MSK: no joint pain, myalgias, back pain ?Neuro: no dizziness, headache, weakness, vertigo ?Psych: no depression, anxiety, insomnia, SI  ? ?Objective:  ? ?Today's Vitals: BP 122/74   Pulse 82   Temp 98.5 ?F (36.9 ?C) (Temporal)   Ht 5\' 5"  (1.651 m)   Wt 193 lb 4 oz (87.7 kg)   LMP 07/22/2021 (Exact Date)   SpO2 97%   BMI 32.16 kg/m?  ? ?Physical Exam  ?Gen: [facility]DWN NAD OF ?HEENT: NCAT, conjunctiva not injected, sclera nonicteric ?TM WNL B, OP moist, no exudates  ?NECK:  supple, no thyromegaly, no nodes,  no carotid bruits ?CARDIAC: RRR, S1S2+, no murmur. DP 2+B ?LUNGS: CTAB. No wheezes ?ABDOMEN:  BS+, soft, NTND, No HSM, no masses ?EXT:  no edema ?MSK: no gross abnormalities.  ?NEURO: A&O x3.  CN II-XII intact.  ?PSYCH: normal mood. Good eye contact  ? ?Assessment & Plan:  ? ?Problem List Items Addressed This Visit   ? ?  ? Cardiovascular and Mediastinum  ? Essential hypertension - Primary  ? Relevant Orders  ? Basic metabolic panel  ? Lipid panel  ? ?Other Visit Diagnoses   ? ? Need for hepatitis C screening test      ? Relevant Orders  ? Hepatitis C antibody  ? Screening for HIV without presence of risk factors      ? Relevant Orders  ? HIV Antibody (routine testing w rflx)  ? Encounter for screening mammogram for malignant neoplasm of breast      ? Relevant Orders  ? MM Digital Screening  ? Screen for colon cancer      ? Relevant Orders  ? Ambulatory referral to Gastroenterology  ? ?  ? HTN-better controlled-great here but up some at home/work-try manual rather than auto cuff.  Cont meds.  Check BMP, lipids as no labs since Sept which was prior to diuretic  f/u 6 mo.  Work on 03-05-1977 ?Screening tests ordered.  Will do pap 8mo at f/u ? ?Outpatient Encounter Medications as of 07/22/2021  ?Medication Sig  ? acetaminophen (TYLENOL) 500 MG tablet Take 500 mg by mouth every 6 (six) hours as needed.  ? amLODipine (NORVASC) 5 MG tablet Take 1 tablet (5 mg total) by mouth daily.  ? chlorthalidone (HYGROTON) 25 MG tablet Take 0.5 tablets (12.5 mg total) by mouth daily.  ? diphenhydrAMINE (SOMINEX) 25 MG tablet Take 25 mg by mouth as needed for sleep.  ? [DISCONTINUED] Multiple Vitamin (MULTIVITAMIN) tablet Take 1 tablet by mouth daily. (Patient not taking: Reported on 07/22/2021)  ? [DISCONTINUED] naproxen (NAPROSYN) 500 MG tablet Take 1 tablet (500 mg total) by mouth 2 (two) times daily. (Patient not taking: Reported on 07/22/2021)  ? [DISCONTINUED] Omega-3 Fatty Acids (FISH OIL OMEGA-3 PO) Take by mouth. (Patient not taking:  Reported on 07/22/2021)  ? ?No facility-administered encounter medications on file as of 07/22/2021.  ? ? ?Follow-up: Return in about 6 months (around 01/22/2022) for pap/annual.  ? ?03/24/2022, MD ?

## 2021-07-23 ENCOUNTER — Other Ambulatory Visit: Payer: Self-pay | Admitting: Family Medicine

## 2021-07-23 DIAGNOSIS — Z1231 Encounter for screening mammogram for malignant neoplasm of breast: Secondary | ICD-10-CM

## 2021-07-23 LAB — LIPID PANEL
Cholesterol: 200 mg/dL (ref 0–200)
HDL: 64.6 mg/dL (ref >39.00)
LDL Cholesterol: 123 mg/dL — ABNORMAL HIGH (ref 0–99)
NonHDL: 135.85
Total CHOL/HDL Ratio: 3
Triglycerides: 62 mg/dL (ref 0.0–149.0)
VLDL: 12.4 mg/dL (ref 0.0–40.0)

## 2021-07-23 LAB — HEPATITIS C ANTIBODY
Hepatitis C Ab: NONREACTIVE
SIGNAL TO CUT-OFF: 0.07 (ref <1.00)

## 2021-07-23 LAB — HIV ANTIBODY (ROUTINE TESTING W REFLEX): HIV 1&2 Ab, 4th Generation: NONREACTIVE

## 2021-07-23 LAB — BASIC METABOLIC PANEL
BUN: 10 mg/dL (ref 6–23)
CO2: 27 mEq/L (ref 19–32)
Calcium: 9 mg/dL (ref 8.4–10.5)
Chloride: 104 mEq/L (ref 96–112)
Creatinine, Ser: 0.69 mg/dL (ref 0.40–1.20)
GFR: 103.97 mL/min (ref >60.00)
Glucose, Bld: 82 mg/dL (ref 70–99)
Potassium: 3.7 mEq/L (ref 3.5–5.1)
Sodium: 140 mEq/L (ref 135–145)

## 2021-08-01 ENCOUNTER — Ambulatory Visit
Admission: RE | Admit: 2021-08-01 | Discharge: 2021-08-01 | Disposition: A | Discharge status: Home / Self Care | Payer: No Typology Code available for payment source | Source: Ambulatory Visit | Attending: Family Medicine | Admitting: Family Medicine

## 2021-08-01 ENCOUNTER — Other Ambulatory Visit: Payer: Self-pay

## 2021-08-01 DIAGNOSIS — Z1231 Encounter for screening mammogram for malignant neoplasm of breast: Secondary | ICD-10-CM

## 2021-08-04 ENCOUNTER — Other Ambulatory Visit: Payer: Self-pay | Admitting: Family Medicine

## 2021-08-04 DIAGNOSIS — R928 Other abnormal and inconclusive findings on diagnostic imaging of breast: Secondary | ICD-10-CM

## 2021-08-20 ENCOUNTER — Other Ambulatory Visit: Payer: Self-pay | Admitting: Family Medicine

## 2021-08-20 ENCOUNTER — Ambulatory Visit
Admission: RE | Admit: 2021-08-20 | Discharge: 2021-08-20 | Disposition: A | Discharge status: Home / Self Care | Payer: No Typology Code available for payment source | Source: Ambulatory Visit | Attending: Family Medicine | Admitting: Family Medicine

## 2021-08-20 DIAGNOSIS — R928 Other abnormal and inconclusive findings on diagnostic imaging of breast: Secondary | ICD-10-CM

## 2021-08-20 DIAGNOSIS — R921 Mammographic calcification found on diagnostic imaging of breast: Secondary | ICD-10-CM

## 2021-11-19 ENCOUNTER — Ambulatory Visit: Payer: No Typology Code available for payment source | Admitting: Nurse Practitioner

## 2021-11-19 DIAGNOSIS — L2082 Flexural eczema: Secondary | ICD-10-CM

## 2021-11-19 DIAGNOSIS — L409 Psoriasis, unspecified: Secondary | ICD-10-CM

## 2021-11-19 MED ORDER — FLUOCINOLONE ACETONIDE SCALP 0.01 % EX OIL: TOPICAL_OIL | CUTANEOUS | 0 refills | Status: DC

## 2021-11-19 MED ORDER — PREDNISONE 20 MG PO TABS: 20.0000 mg | ORAL_TABLET | Freq: Every day | ORAL | 0 refills | Status: AC

## 2021-11-19 NOTE — Progress Notes (Signed)
Acute Office Visit  Subjective:     Patient ID: Vanessa Shannon, female    DOB: 03/26/1976, 46 y.o.   MRN: [id number]  Chief Complaint  Patient presents with   Psoriasis   Patient presents today for c/o of increased itching of psoriasis on scalp. States she was diagnosed several years ago and at one point was following a dermatologist who confirmed diagnosis. Also reports hx of eczema.  She reports that over the past few days she has noticed increased flare-ups in both scalp psoriasis and eczema. Reports nothing seems to make this worse. She has triamcinolone cream at home which has not been very effective in helping ezcema. She has tried some essential oils on her scalp which have been ineffective. States that she was previously prescribed scalp oil, possibly fluocinolone, which seemed to help symptoms. Would like prescription for this.   Review of Systems  Skin:  Positive for itching and rash.      Objective:    There were no vitals taken for this visit.   Physical Exam Constitutional:      General: She is not in acute distress. HENT:     Head: Normocephalic. Hair is normal.     Comments: Scalp with several areas of flaking as well as eyrthema noted mid scalp.  Pulmonary:     Effort: Pulmonary effort is normal.  Skin:    Findings: Rash (noted on bilateral elbows and under right arm.) present.  Neurological:     General: No focal deficit present.     Mental Status: She is alert.     No results found for any visits on 11/19/21.      Assessment & Plan:    Visit Diagnoses     Psoriasis of scalp    -  Primary   Relevant Medications   Fluocinolone Acetonide Scalp 0.01 % OIL nightly as needed.   predniSONE (DELTASONE) 20 MG tablet Due to flare of both eczema and psoriasis, will treat with short course of steroids x 5 days.    Flexural eczema       Relevant Medications   predniSONE (DELTASONE) 20 MG tablet       Meds ordered this encounter  Medications    Fluocinolone Acetonide Scalp 0.01 % OIL    Sig: Apply topically to the affected scalp area(s) once daily at night.    Dispense:  118.28 mL    Refill:  0   predniSONE (DELTASONE) 20 MG tablet    Sig: Take 1 tablet (20 mg total) by mouth daily with breakfast for 5 days.    Dispense:  5 tablet    Refill:  0    Follow-up as needed.   Gloris Ham, NP

## 2022-02-18 ENCOUNTER — Ambulatory Visit: Payer: No Typology Code available for payment source | Admitting: Nurse Practitioner

## 2022-02-18 ENCOUNTER — Encounter: Payer: Self-pay | Admitting: Nurse Practitioner

## 2022-02-18 VITALS — BP 130/88 | HR 91

## 2022-02-18 DIAGNOSIS — Z789 Other specified health status: Secondary | ICD-10-CM

## 2022-02-18 NOTE — Progress Notes (Signed)
Office Visit  Subjective:  Patient ID: Vanessa Shannon, female    DOB: 1976-03-22  Age: 46 y.o. MRN: [id number]  CC: Wellness exam  HPI Vanessa Shannon presents for wellness exam visit for insurance benefit.  Patient has a PCP: Dr. Cherlynn Kaiser  PMH significant for: HTN  Last labs per PCP were completed: March 2023  Health Maintenance:  Colonoscopy: July 2023 Mammogram: March 2023 Pap Smear: due for this.     Smoker: Never   Immunizations:  Tdap- 2017 COVID- x4  Lifestyle: Diet- no particular diet.  Exercise- walks 3 times a week for 30 min.     Past Medical History:  Diagnosis Date   Allergy    seasonal   Anemia 2002   postop hemorrhage   Blood transfusion without reported diagnosis    uterine laceration with first CS post cesarean section   GERD (gastroesophageal reflux disease)    Headache    Heartburn during pregnancy    History of 3 cesarean sections 03/23/2016   P1 in 2002: pushed 3 hours before C/S, had cervical tear and PPH P2 in 2016: scheduled repeat LTCS   Hypertension     Past Surgical History:  Procedure Laterality Date   CESAREAN SECTION  2002   CESAREAN SECTION N/A 11/19/2014   Procedure: CESAREAN SECTION;  Surgeon: Cheri Fowler, MD;  Location: [facility];  Service: Obstetrics;  Laterality: N/A;   CESAREAN SECTION WITH BILATERAL TUBAL LIGATION Bilateral 04/24/2016   Procedure: CESAREAN SECTION WITH BILATERAL TUBAL LIGATION;  Surgeon: Lavonia Drafts, MD;  Location: [facility];  Service: Obstetrics;  Laterality: Bilateral;   LAPAROSCOPY     emergency laproscopic surgery in 2002    Outpatient Medications Prior to Visit  Medication Sig Dispense Refill   acetaminophen (TYLENOL) 500 MG tablet Take 500 mg by mouth every 6 (six) hours as needed.     amLODipine (NORVASC) 5 MG tablet Take 1 tablet (5 mg total) by mouth daily. 90 tablet 2   chlorthalidone (HYGROTON) 25 MG tablet Take 0.5 tablets (12.5 mg total) by mouth daily. 30 tablet 2    diphenhydrAMINE (SOMINEX) 25 MG tablet Take 25 mg by mouth as needed for sleep.     Fluocinolone Acetonide Scalp 0.01 % OIL Apply topically to the affected scalp area(s) once daily at night. 118.28 mL 0   No facility-administered medications prior to visit.    ROS Review of Systems  Respiratory:  Negative for shortness of breath.   Cardiovascular:  Negative for chest pain and leg swelling.  Allergic/Immunologic: Positive for environmental allergies.    Objective:  BP (!) 150/88   Pulse 91   SpO2 98%   Physical Exam Constitutional:      General: She is not in acute distress. HENT:     Head: Normocephalic.  Cardiovascular:     Rate and Rhythm: Normal rate and regular rhythm.     Pulses: Normal pulses.     Heart sounds: Normal heart sounds.  Pulmonary:     Effort: Pulmonary effort is normal.     Breath sounds: Normal breath sounds.  Musculoskeletal:        General: Normal range of motion.     Right lower leg: No edema.     Left lower leg: No edema.  Skin:    General: Skin is warm.  Neurological:     General: No focal deficit present.     Mental Status: She is alert and oriented to person, place, and time.  Psychiatric:  Mood and Affect: Mood normal.        Behavior: Behavior normal.      Assessment & Plan:    Diagnoses and all orders for this visit:  Participant in health and wellness plan Adult wellness physical was conducted today. Importance of diet and exercise were discussed in detail.  We reviewed immunizations and gave recommendations regarding current immunization needed for age.  Preventative health exams needed: Pap Smear. Patient will update this with her PCP.   Patient was advised yearly wellness exam    No orders of the defined types were placed in this encounter.   No orders of the defined types were placed in this encounter.   Follow-up: as needed

## 2022-02-22 ENCOUNTER — Ambulatory Visit
Admission: RE | Admit: 2022-02-22 | Discharge: 2022-02-22 | Disposition: A | Discharge status: Home / Self Care | Payer: No Typology Code available for payment source | Source: Ambulatory Visit | Attending: Family Medicine | Admitting: Family Medicine

## 2022-02-22 DIAGNOSIS — R921 Mammographic calcification found on diagnostic imaging of breast: Secondary | ICD-10-CM

## 2022-02-24 ENCOUNTER — Other Ambulatory Visit: Payer: Self-pay | Admitting: Family Medicine

## 2022-02-24 DIAGNOSIS — R921 Mammographic calcification found on diagnostic imaging of breast: Secondary | ICD-10-CM

## 2022-03-09 ENCOUNTER — Ambulatory Visit
Admission: RE | Admit: 2022-03-09 | Discharge: 2022-03-09 | Disposition: A | Discharge status: Home / Self Care | Payer: No Typology Code available for payment source | Source: Ambulatory Visit | Attending: Family Medicine | Admitting: Family Medicine

## 2022-03-09 DIAGNOSIS — R921 Mammographic calcification found on diagnostic imaging of breast: Secondary | ICD-10-CM

## 2022-03-09 HISTORY — PX: BREAST BIOPSY: SHX20

## 2022-03-23 ENCOUNTER — Other Ambulatory Visit: Payer: Self-pay | Admitting: Family Medicine

## 2022-03-23 DIAGNOSIS — I10 Essential (primary) hypertension: Secondary | ICD-10-CM

## 2022-03-24 NOTE — Telephone Encounter (Signed)
Rx sent to pharmacy. Left detailed message to return call to office to schedule follow-up.

## 2022-04-29 ENCOUNTER — Encounter: Payer: Self-pay | Admitting: *Deleted

## 2022-06-18 ENCOUNTER — Other Ambulatory Visit: Payer: Self-pay | Admitting: Family Medicine

## 2022-06-18 DIAGNOSIS — I10 Essential (primary) hypertension: Secondary | ICD-10-CM

## 2022-06-18 NOTE — Telephone Encounter (Signed)
Vml for patient to call back and sch ov

## 2022-07-06 ENCOUNTER — Encounter: Payer: Self-pay | Admitting: Nurse Practitioner

## 2022-07-06 ENCOUNTER — Ambulatory Visit: Payer: No Typology Code available for payment source | Admitting: Nurse Practitioner

## 2022-07-06 VITALS — BP 136/82 | HR 88 | Temp 97.6°F

## 2022-07-06 DIAGNOSIS — L409 Psoriasis, unspecified: Secondary | ICD-10-CM

## 2022-07-06 DIAGNOSIS — Z789 Other specified health status: Secondary | ICD-10-CM

## 2022-07-06 DIAGNOSIS — R5383 Other fatigue: Secondary | ICD-10-CM

## 2022-07-06 MED ORDER — FLUOCINOLONE ACETONIDE SCALP 0.01 % EX OIL: TOPICAL_OIL | CUTANEOUS | 2 refills | Status: AC

## 2022-07-06 NOTE — Progress Notes (Signed)
Office Visit  Subjective:  Patient ID: Vanessa Shannon, female    DOB: [birth year]  Age: 47 y.o. MRN: [id number]  CC: Wellness exam  HPI Vanessa Shannon presents for wellness exam visit for insurance benefit.  Patient has a PCP: Dr. Cherlynn Kaiser  PMH significant for: HTN on amlodipine  Last labs per PCP were completed: March 2023  Health Maintenance:  Colonoscopy: July 2023 Mammogram: March 2023 Pap Smear: due for this, planning to schedule this with her PCP next month.      Smoker: Never   Immunizations:  Tdap- 2017 COVID- x4 Flu- yearly  Lifestyle: Diet- no particular diet. Denies eating more then usual. Endorses increased water intake.  Exercise- tried to walk 3 times a week for 30 min.    Acute concerns: Reports increased fatigue and hair loss. Noticed this 1 month ago. On sominex, has trouble staying asleep. No trouble falling sleep.  Hair loss has been present for 2 years. Hx of scalp psoriasis.   Past Medical History:  Diagnosis Date   Allergy    seasonal   Anemia 2002   postop hemorrhage   Blood transfusion without reported diagnosis    uterine laceration with first CS post cesarean section   GERD (gastroesophageal reflux disease)    Headache    Heartburn during pregnancy    History of 3 cesarean sections 03/23/2016   P1 in 2002: pushed 3 hours before C/S, had cervical tear and PPH P2 in 2016: scheduled repeat LTCS   Hypertension     Past Surgical History:  Procedure Laterality Date   CESAREAN SECTION  2002   CESAREAN SECTION N/A 11/19/2014   Procedure: CESAREAN SECTION;  Surgeon: Cheri Fowler, MD;  Location: [facility];  Service: Obstetrics;  Laterality: N/A;   CESAREAN SECTION WITH BILATERAL TUBAL LIGATION Bilateral 04/24/2016   Procedure: CESAREAN SECTION WITH BILATERAL TUBAL LIGATION;  Surgeon: Lavonia Drafts, MD;  Location: [facility];  Service: Obstetrics;  Laterality: Bilateral;   LAPAROSCOPY     emergency laproscopic surgery in 2002     Outpatient Medications Prior to Visit  Medication Sig Dispense Refill   amLODipine (NORVASC) 5 MG tablet TAKE 1 TABLET (5 MG TOTAL) BY MOUTH DAILY. 30 tablet 0   diphenhydrAMINE (SOMINEX) 25 MG tablet Take 25 mg by mouth as needed for sleep.     Fluocinolone Acetonide Scalp 0.01 % OIL Apply topically to the affected scalp area(s) once daily at night. 118.28 mL 0   acetaminophen (TYLENOL) 500 MG tablet Take 500 mg by mouth every 6 (six) hours as needed.     chlorthalidone (HYGROTON) 25 MG tablet Take 0.5 tablets (12.5 mg total) by mouth daily. (Patient not taking: Reported on 07/06/2022) 30 tablet 2   No facility-administered medications prior to visit.    ROS Review of Systems  Constitutional:  Positive for fatigue and unexpected weight change (increased weight (10 pounds)). Negative for appetite change.  HENT:  Negative for hearing loss.   Eyes: Negative.   Respiratory:  Negative for cough and shortness of breath.   Cardiovascular:  Negative for chest pain and leg swelling.  Gastrointestinal:  Positive for abdominal pain (some acid reflux) and constipation (sometimes). Negative for diarrhea.  Endocrine: Positive for cold intolerance (reports pain (bone and muscle) if too cold).  Musculoskeletal:  Negative for arthralgias and joint swelling.  Allergic/Immunologic: Positive for environmental allergies.  Neurological:  Positive for headaches (chronic).  Psychiatric/Behavioral:  Positive for sleep disturbance. The patient is not nervous/anxious.  Objective:  BP 136/82   Pulse 88   Temp 97.6 F (36.4 C)   SpO2 99%   Physical Exam Constitutional:      General: She is not in acute distress. HENT:     Head: Normocephalic.     Nose: Nose normal.     Mouth/Throat:     Mouth: Mucous membranes are moist.     Pharynx: No oropharyngeal exudate or posterior oropharyngeal erythema.  Cardiovascular:     Rate and Rhythm: Normal rate and regular rhythm.     Pulses: Normal pulses.      Heart sounds: Normal heart sounds.  Pulmonary:     Effort: Pulmonary effort is normal.     Breath sounds: Normal breath sounds.  Abdominal:     General: Abdomen is flat.  Musculoskeletal:        General: Normal range of motion.     Right lower leg: No edema.     Left lower leg: No edema.  Skin:    General: Skin is warm.  Neurological:     General: No focal deficit present.     Mental Status: She is alert and oriented to person, place, and time.  Psychiatric:        Mood and Affect: Mood normal.        Behavior: Behavior normal.      Assessment & Plan:    Diagnoses and all orders for this visit:  Participant in health and wellness plan Adult wellness physical was conducted today. Importance of diet and exercise were discussed in detail.  We reviewed immunizations and gave recommendations regarding current immunization needed for age. Immunizations are up to date at this time.  Preventative health exams needed: Pap smear needed. Patient will follow-up with PCP.    Patient was advised yearly wellness exam and follow-up with PCP as recommended.   Fatigue, unspecified type - Plan: will check labs today for further eval.  Comprehensive metabolic panel, CBC with Differential, Vitamin D, 25-hydroxy, TSH, Vitamin B12  Psoriasis of scalp - refill requested- Fluocinolone Acetonide Scalp 0.01 % OIL    Orders Placed This Encounter  Procedures   Comprehensive metabolic panel   CBC with Differential   Vitamin D, 25-hydroxy   TSH   Vitamin B12    Meds ordered this encounter  Medications   Fluocinolone Acetonide Scalp 0.01 % OIL    Sig: Apply topically to the affected scalp area(s) once daily at night.    Dispense:  118.28 mL    Refill:  2    Follow-up: as needed

## 2022-07-07 LAB — CBC WITH DIFFERENTIAL/PLATELET
Absolute Monocytes: 485 cells/uL (ref 200–950)
Basophils Absolute: 40 cells/uL (ref 0–200)
Basophils Relative: 0.8 %
Eosinophils Absolute: 90 cells/uL (ref 15–500)
Eosinophils Relative: 1.8 %
HCT: 36.1 % (ref 35.0–45.0)
Hemoglobin: 12.3 g/dL (ref 11.7–15.5)
Lymphs Abs: 2095 cells/uL (ref 850–3900)
MCH: 31.5 pg (ref 27.0–33.0)
MCHC: 34.1 g/dL (ref 32.0–36.0)
MCV: 92.6 fL (ref 80.0–100.0)
MPV: 12.3 fL (ref 7.5–12.5)
Monocytes Relative: 9.7 %
Neutro Abs: 2290 cells/uL (ref 1500–7800)
Neutrophils Relative %: 45.8 %
Platelets: 207 10*3/uL (ref 140–400)
RBC: 3.9 10*6/uL (ref 3.80–5.10)
RDW: 11 % (ref 11.0–15.0)
Total Lymphocyte: 41.9 %
WBC: 5 10*3/uL (ref 3.8–10.8)

## 2022-07-07 LAB — COMPREHENSIVE METABOLIC PANEL
AG Ratio: 1.4 (calc) (ref 1.0–2.5)
ALT: 15 U/L (ref 6–29)
AST: 14 U/L (ref 10–35)
Albumin: 4.2 g/dL (ref 3.6–5.1)
Alkaline phosphatase (APISO): 64 U/L (ref 31–125)
BUN: 13 mg/dL (ref 7–25)
CO2: 27 mmol/L (ref 20–32)
Calcium: 8.7 mg/dL (ref 8.6–10.2)
Chloride: 105 mmol/L (ref 98–110)
Creat: 0.69 mg/dL (ref 0.50–0.99)
Globulin: 2.9 g/dL (calc) (ref 1.9–3.7)
Glucose, Bld: 88 mg/dL (ref 65–99)
Potassium: 3.7 mmol/L (ref 3.5–5.3)
Sodium: 140 mmol/L (ref 135–146)
Total Bilirubin: 0.4 mg/dL (ref 0.2–1.2)
Total Protein: 7.1 g/dL (ref 6.1–8.1)

## 2022-07-07 LAB — VITAMIN B12: Vitamin B-12: 382 pg/mL (ref 200–1100)

## 2022-07-07 LAB — TSH: TSH: 1.08 mIU/L

## 2022-07-07 LAB — VITAMIN D 25 HYDROXY (VIT D DEFICIENCY, FRACTURES): Vit D, 25-Hydroxy: 13 ng/mL — ABNORMAL LOW (ref 30–100)

## 2022-07-08 ENCOUNTER — Other Ambulatory Visit: Payer: Self-pay | Admitting: Nurse Practitioner

## 2022-07-08 DIAGNOSIS — E559 Vitamin D deficiency, unspecified: Secondary | ICD-10-CM | POA: Insufficient documentation

## 2022-07-08 MED ORDER — VITAMIN D (ERGOCALCIFEROL) 1.25 MG (50000 UNIT) PO CAPS: 50000.0000 [IU] | ORAL_CAPSULE | ORAL | 0 refills | Status: DC

## 2022-07-08 NOTE — Progress Notes (Signed)
Labs reveal Vit D def, plan to replenish.

## 2022-07-12 ENCOUNTER — Other Ambulatory Visit: Payer: Self-pay | Admitting: Family Medicine

## 2022-07-12 DIAGNOSIS — I10 Essential (primary) hypertension: Secondary | ICD-10-CM

## 2022-07-16 ENCOUNTER — Other Ambulatory Visit: Payer: Self-pay | Admitting: Family Medicine

## 2022-07-16 DIAGNOSIS — I10 Essential (primary) hypertension: Secondary | ICD-10-CM

## 2022-07-16 NOTE — Telephone Encounter (Signed)
Needs appt

## 2022-07-27 NOTE — Addendum Note (Signed)
Encounter addended by: Chipper Oman, RT on: 07/27/2022 1:05 PM  Actions taken: Imaging Exam ended

## 2022-07-29 ENCOUNTER — Encounter: Payer: Self-pay | Admitting: Family Medicine

## 2022-07-29 ENCOUNTER — Other Ambulatory Visit (HOSPITAL_COMMUNITY)
Admission: RE | Admit: 2022-07-29 | Discharge: 2022-07-29 | Disposition: A | Discharge status: Home / Self Care | Payer: No Typology Code available for payment source | Source: Ambulatory Visit | Attending: Family Medicine | Admitting: Family Medicine

## 2022-07-29 ENCOUNTER — Ambulatory Visit: Payer: No Typology Code available for payment source | Admitting: Family Medicine

## 2022-07-29 VITALS — BP 124/70 | HR 73 | Temp 98.3°F | Ht 65.0 in | Wt 203.5 lb

## 2022-07-29 DIAGNOSIS — Z01419 Encounter for gynecological examination (general) (routine) without abnormal findings: Secondary | ICD-10-CM | POA: Diagnosis not present

## 2022-07-29 DIAGNOSIS — Z0000 Encounter for general adult medical examination without abnormal findings: Secondary | ICD-10-CM

## 2022-07-29 DIAGNOSIS — Z124 Encounter for screening for malignant neoplasm of cervix: Secondary | ICD-10-CM | POA: Insufficient documentation

## 2022-07-29 LAB — HM PAP SMEAR: HM Pap smear: NORMAL

## 2022-07-29 LAB — RESULTS CONSOLE HPV: CHL HPV: NEGATIVE

## 2022-07-29 NOTE — Patient Instructions (Signed)
It was very nice to see you today!  Keep working on diet/exercise   PLEASE NOTE:  If you had any lab tests please let us know if you have not heard back within a few days. You may see your results on MyChart before we have a chance to review them but we will give you a call once they are reviewed by us. If we ordered any referrals today, please let us know if you have not heard from their office within the next week.   Please try these tips to maintain a healthy lifestyle:  Eat most of your calories during the day when you are active. Eliminate processed foods including packaged sweets (pies, cakes, cookies), reduce intake of potatoes, white bread, white pasta, and white rice. Look for whole grain options, oat flour or almond flour.  Each meal should contain half fruits/vegetables, one quarter protein, and one quarter carbs (no bigger than a computer mouse).  Cut down on sweet beverages. This includes juice, soda, and sweet tea. Also watch fruit intake, though this is a healthier sweet option, it still contains natural sugar! Limit to 3 servings daily.  Drink at least 1 glass of water with each meal and aim for at least 8 glasses per day  Exercise at least 150 minutes every week.   

## 2022-07-29 NOTE — Progress Notes (Signed)
Phone [phone]   Subjective:   Patient is a 47 y.o. female presenting for annual physical.    Chief Complaint  Patient presents with   Annual Exam    CPE with pap Not fasting     Annual w/pap  See problem oriented charting- ROS- ROS: Gen: no fever, chills  Skin: no rash, itching ENT: no ear pain, ear drainage, nasal congestion, rhinorrhea, sinus pressure, sore throat Eyes: no blurry vision, double vision Resp: no cough, wheeze,SOB CV: no CP, palpitations, LE edema,  GI: no heartburn, n/v/d/c, abd pain GU: no dysuria, urgency, frequency, hematuria MSK: no joint pain, myalgias, back pain Neuro: no dizziness, headache, weakness, vertigo Psych: no depression, anxiety, insomnia, SI   The following were reviewed and entered/updated in epic: Past Medical History:  Diagnosis Date   Allergy    seasonal   Anemia 2002   postop hemorrhage   Blood transfusion without reported diagnosis    uterine laceration with first CS post cesarean section   GERD (gastroesophageal reflux disease)    Headache    Heartburn during pregnancy    History of 3 cesarean sections 03/23/2016   P1 in 2002: pushed 3 hours before C/S, had cervical tear and PPH P2 in 2016: scheduled repeat LTCS   Hypertension    Patient Active Problem List   Diagnosis Date Noted   Vitamin D deficiency 07/08/2022   Essential hypertension 01/20/2017   Allergic rhinitis 08/09/2013   Past Surgical History:  Procedure Laterality Date   CESAREAN SECTION  2002   CESAREAN SECTION N/A 11/19/2014   Procedure: CESAREAN SECTION;  Surgeon: Cheri Fowler, MD;  Location: [facility];  Service: Obstetrics;  Laterality: N/A;   CESAREAN SECTION WITH BILATERAL TUBAL LIGATION Bilateral 04/24/2016   Procedure: CESAREAN SECTION WITH BILATERAL TUBAL LIGATION;  Surgeon: Lavonia Drafts, MD;  Location: [facility];  Service: Obstetrics;  Laterality: Bilateral;   LAPAROSCOPY     emergency laproscopic surgery in 2002     Family History  Problem Relation Age of Onset   Hyperlipidemia Mother    Arthritis Mother    Lupus Mother    Hypertension Mother    Hypertension Father    Cancer Father        brain cancer   Kidney disease Maternal Grandmother    Stroke Maternal Grandmother    Hypertension Maternal Grandmother    Kidney disease Paternal Grandmother    Kidney disease Paternal Grandfather    Stroke Paternal Grandfather     Medications- reviewed and updated Current Outpatient Medications  Medication Sig Dispense Refill   acetaminophen (TYLENOL) 500 MG tablet Take 500 mg by mouth every 6 (six) hours as needed.     amLODipine (NORVASC) 5 MG tablet TAKE 1 TABLET (5 MG TOTAL) BY MOUTH DAILY. 30 tablet 0   diphenhydrAMINE (SOMINEX) 25 MG tablet Take 25 mg by mouth as needed for sleep.     Fluocinolone Acetonide Scalp 0.01 % OIL Apply topically to the affected scalp area(s) once daily at night. 118.28 mL 2   Vitamin D, Ergocalciferol, (DRISDOL) 1.25 MG (50000 UNIT) CAPS capsule Take 1 capsule (50,000 Units total) by mouth every 7 (seven) days. 8 capsule 0   No current facility-administered medications for this visit.    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 DP 2+B LUNGS: CTAB. No wheezes ABDOMEN:  BS+, soft, NTND, No HSM, no masses EXT:  no  edema MSK: no gross abnormalities. MS 5/5 all 4 NEURO: A&O x3.  CN II-XII intact.  PSYCH: normal mood. Good eye contact   Breasts: Examined lying and sitting.              Right:   Without masses, retractions,  nipple discharge or axillary adenopathy.               Left:     Without masses, retractions, nipple discharge or axillary adenopathy. Genitourinary              Inguinal/mons:  Normal without inguinal adenopathy             External genitalia:  Normal appearing vulva with no masses, tenderness, or lesions             BUS/Urethra/Skene's glands:  Normal             Vagina:  Normal appearing with normal color and discharge, no lesions             Cervix:  Normal appearing without discharge or lesions             Uterus:  Normal in size, shape and contour.  Midline and mobile, nontender             Adnexa/parametria:                           Rt:        Normal in size, without masses or tenderness.                         Lt:        Normal in size, without masses or tenderness.             Anus and perineum: Normal  Chaperone present QJ  Reviewed labs   Assessment and Plan   Health Maintenance counseling: 1. Anticipatory guidance: Patient counseled regarding regular dental exams q6 months, eye exams,  avoiding smoking and second hand smoke, limiting alcohol to 1 beverage per day, no illicit drugs.   2. Risk factor reduction:  Advised patient of need for regular exercise and diet rich and fruits and vegetables to reduce risk of heart attack and stroke. Exercise- increase.  Wt Readings from Last 3 Encounters:  07/29/22 203 lb 8 oz (92.3 kg)  07/22/21 193 lb 4 oz (87.7 kg)  06/15/18 185 lb (83.9 kg)   3. Immunizations/screenings/ancillary studies Immunization History  Administered Date(s) Administered   Influenza,inj,Quad PF,6+ Mos 02/27/2015   Influenza-Unspecified 01/17/2013, 02/26/2021, 02/28/2022   Moderna Covid-19 Vaccine Bivalent Booster 74yr & up 02/04/2021    Moderna Sars-Covid-2 Vaccination 06/18/2019, 07/16/2019, 03/31/2020   PPD Test 10/17/2015   Tdap 06/17/2006, 08/28/2014, 04/12/2016   Health Maintenance Due  Topic  Date Due   PAP SMEAR-Modifier  01/11/2018    4. Cervical cancer screening- done today 5. Breast cancer screening-  mammogram pt will sch 6. Colon cancer screening - UTD 7. Skin cancer screening- advised regular sunscreen use. Denies worrisome, changing, or new skin lesions.  8. Birth control/STD check- na 9. Osteoporosis screening- n/a 10. Smoking associated screening - non smoker  Problem List Items Addressed This Visit   None Visit Diagnoses     Well woman exam with routine gynecological exam    -  Primary   Relevant Orders   Cytology - PAP( [facility])   Screening for cervical cancer       Relevant Orders   Cytology - PAP( [facility])      Annual w/pap-sent.  Labs done in Feb thru employer-she will do ua and chol w/them.  F/u 1 yr.  Antic guidance  Recommended follow up: annualReturn in about 1 year (around 07/29/2023) for annual. No future appointments.  Lab/Order associations:n/a fasting   ICD-10-CM   1. Well woman exam with routine gynecological exam  Z01.419 Cytology - PAP( [facility])    2. Screening for cervical cancer  Z12.4 Cytology - PAP( [place])      No orders of the defined types were placed in this encounter.   Wellington Hampshire, MD

## 2022-08-03 LAB — CYTOLOGY - PAP
Comment: NEGATIVE
Diagnosis: NEGATIVE
High risk HPV: NEGATIVE

## 2022-08-04 ENCOUNTER — Encounter: Payer: Self-pay | Admitting: Family Medicine

## 2022-08-19 ENCOUNTER — Other Ambulatory Visit: Payer: Self-pay | Admitting: Family Medicine

## 2022-08-19 DIAGNOSIS — I10 Essential (primary) hypertension: Secondary | ICD-10-CM

## 2022-09-14 ENCOUNTER — Other Ambulatory Visit: Payer: No Typology Code available for payment source | Admitting: Nurse Practitioner

## 2022-09-14 DIAGNOSIS — E559 Vitamin D deficiency, unspecified: Secondary | ICD-10-CM

## 2022-09-15 LAB — VITAMIN D 25 HYDROXY (VIT D DEFICIENCY, FRACTURES): Vit D, 25-Hydroxy: 40 ng/mL (ref 30–100)

## 2022-09-15 LAB — LIPID PANEL
Cholesterol: 199 mg/dL (ref <200)
HDL: 64 mg/dL (ref >=50)
LDL Cholesterol (Calc): 118 mg/dL (calc) — ABNORMAL HIGH
Non-HDL Cholesterol (Calc): 135 mg/dL (calc) — ABNORMAL HIGH (ref <130)
Total CHOL/HDL Ratio: 3.1 (calc) (ref <5.0)
Triglycerides: 75 mg/dL (ref <150)

## 2022-09-29 ENCOUNTER — Other Ambulatory Visit: Payer: Self-pay | Admitting: Family Medicine

## 2022-09-29 DIAGNOSIS — Z0000 Encounter for general adult medical examination without abnormal findings: Secondary | ICD-10-CM

## 2022-10-05 ENCOUNTER — Ambulatory Visit
Admission: RE | Admit: 2022-10-05 | Discharge: 2022-10-05 | Disposition: A | Discharge status: Home / Self Care | Payer: No Typology Code available for payment source | Source: Ambulatory Visit | Attending: Family Medicine | Admitting: Family Medicine

## 2022-10-05 DIAGNOSIS — Z0000 Encounter for general adult medical examination without abnormal findings: Secondary | ICD-10-CM

## 2023-01-12 IMAGING — MG MM DIGITAL SCREENING BILAT W/ TOMO AND CAD
8 series · 8 of 24 positions shown · non-contrast
Comparison: None.

CLINICAL DATA: Screening.

EXAM:
DIGITAL SCREENING BILATERAL MAMMOGRAM WITH TOMOSYNTHESIS AND CAD
TECHNIQUE: Bilateral screening digital craniocaudal and mediolateral oblique
mammograms were obtained. Bilateral screening digital breast
tomosynthesis was performed. The images were evaluated with
computer-aided detection.

[L CC synth-2D]
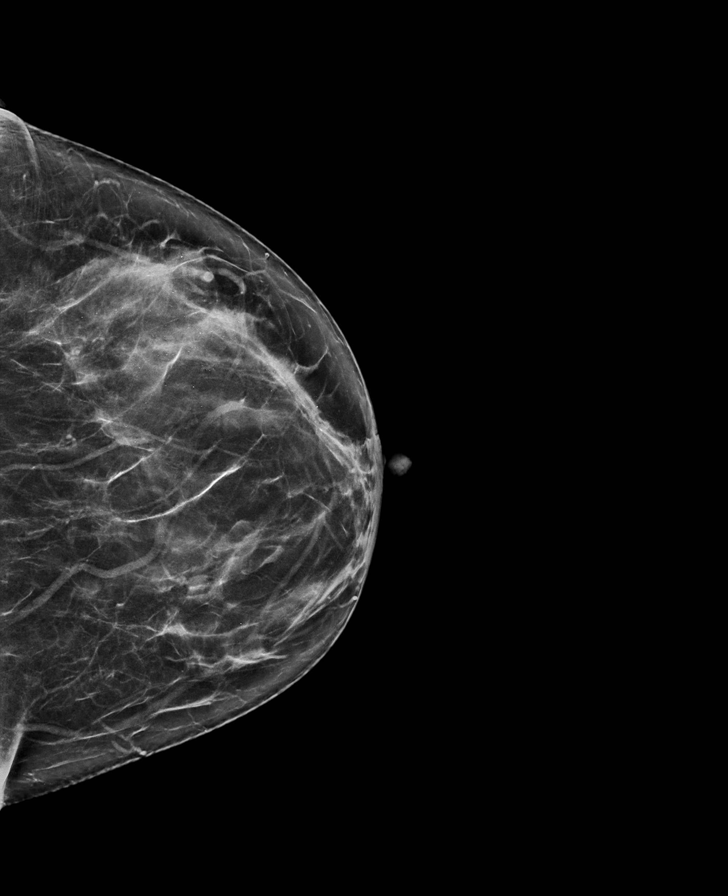

[R MLO synth-2D]
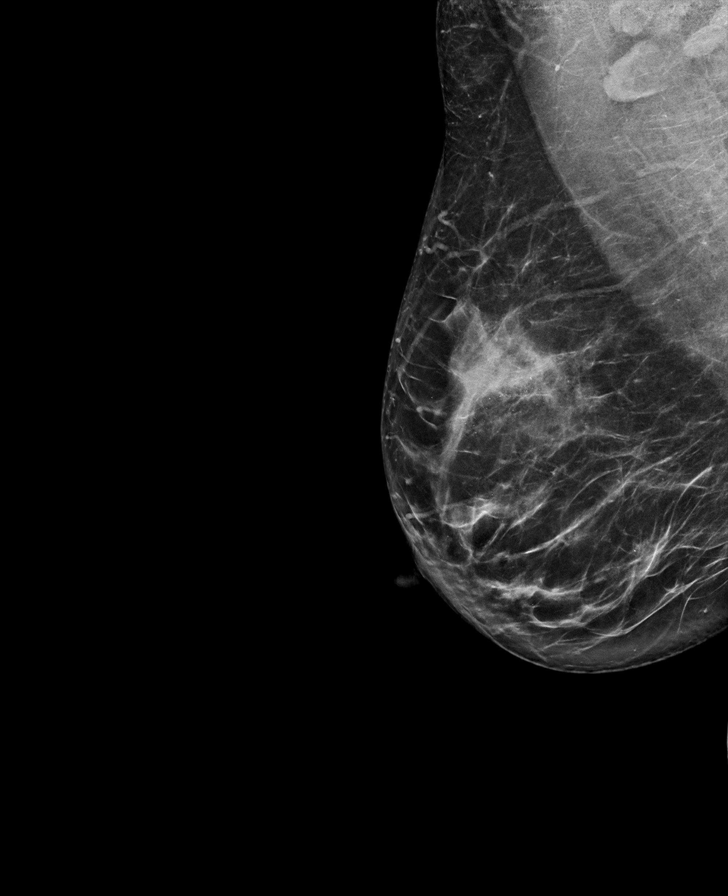

[R CC synth-2D]
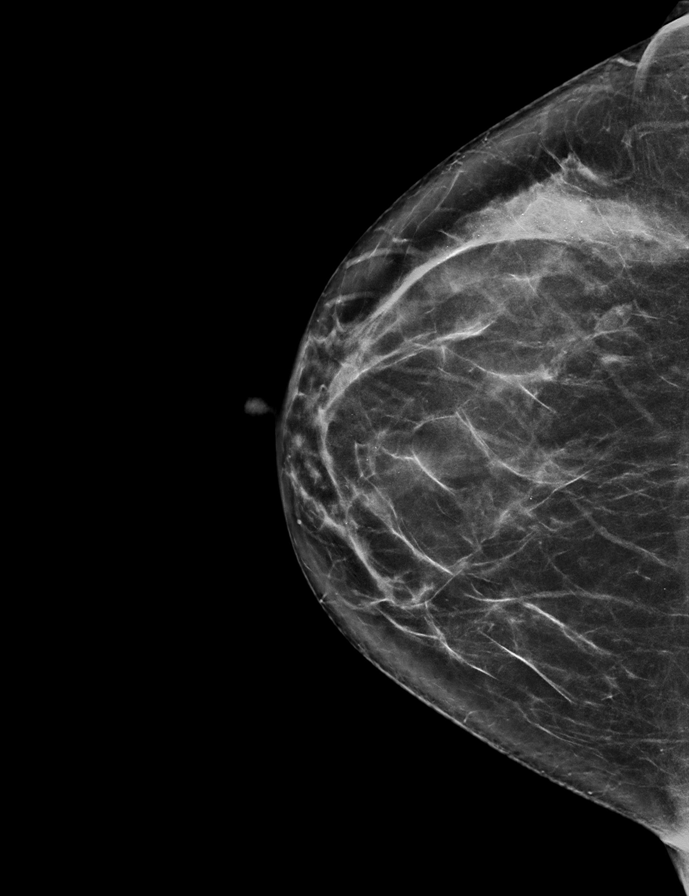

[L MLO synth-2D]
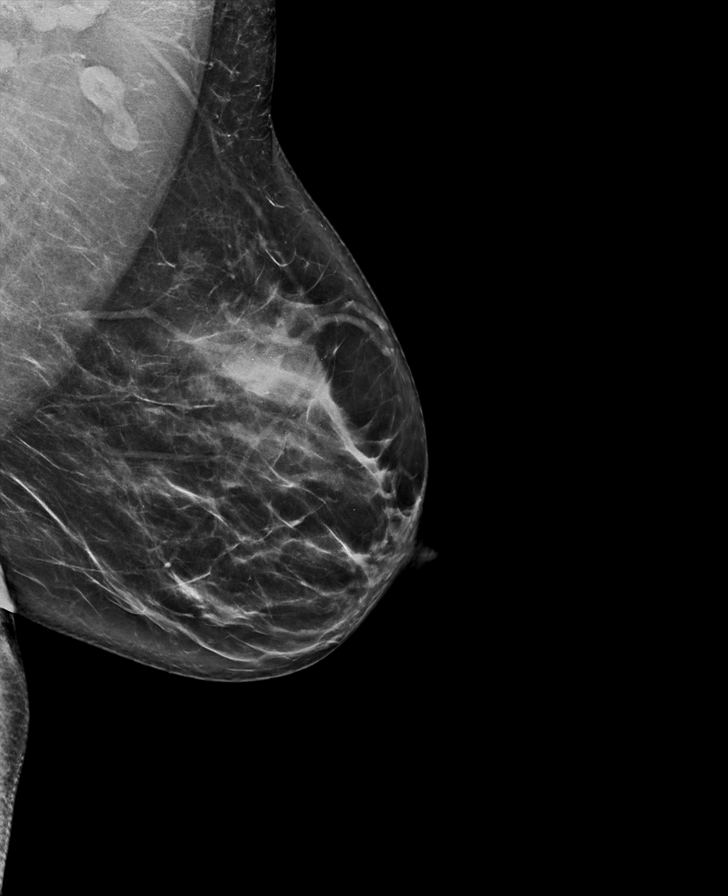

[R MLO tomo · tomo slice 41/80.0]
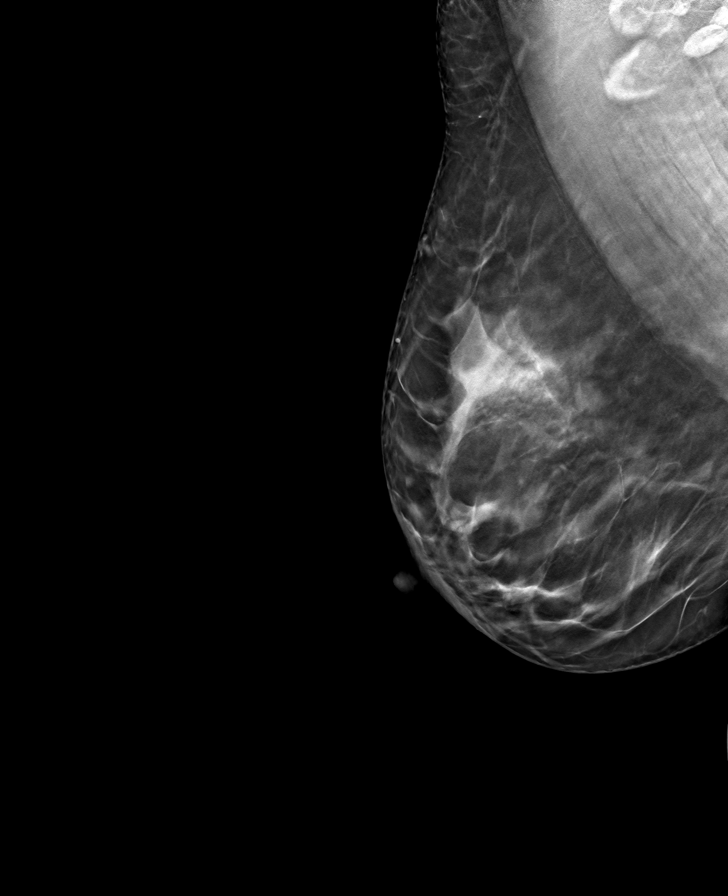

[L CC tomo · tomo slice 35/70.0]
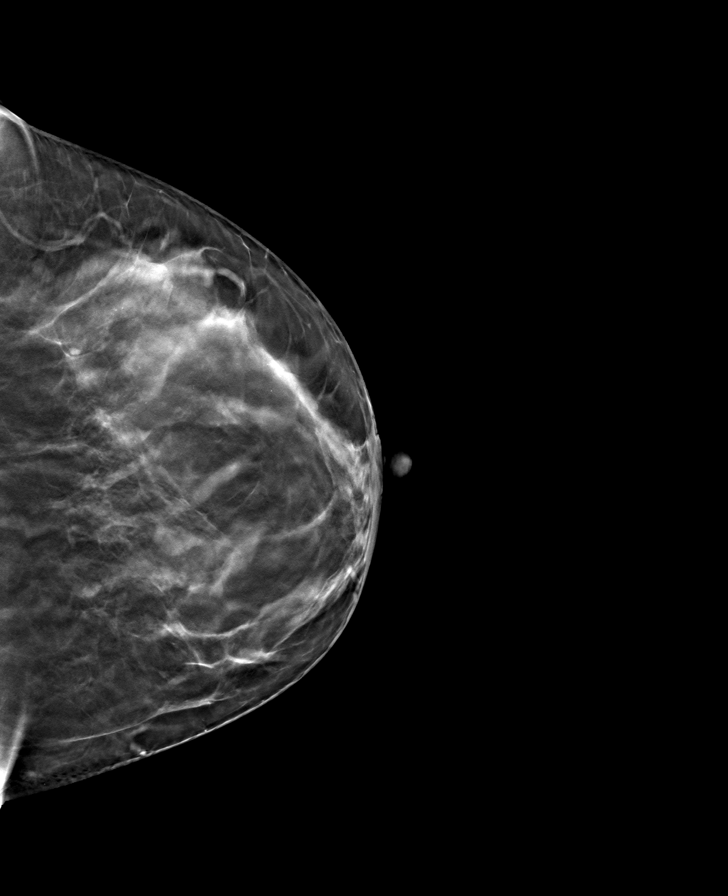

[L MLO tomo · tomo slice 41/81.0]
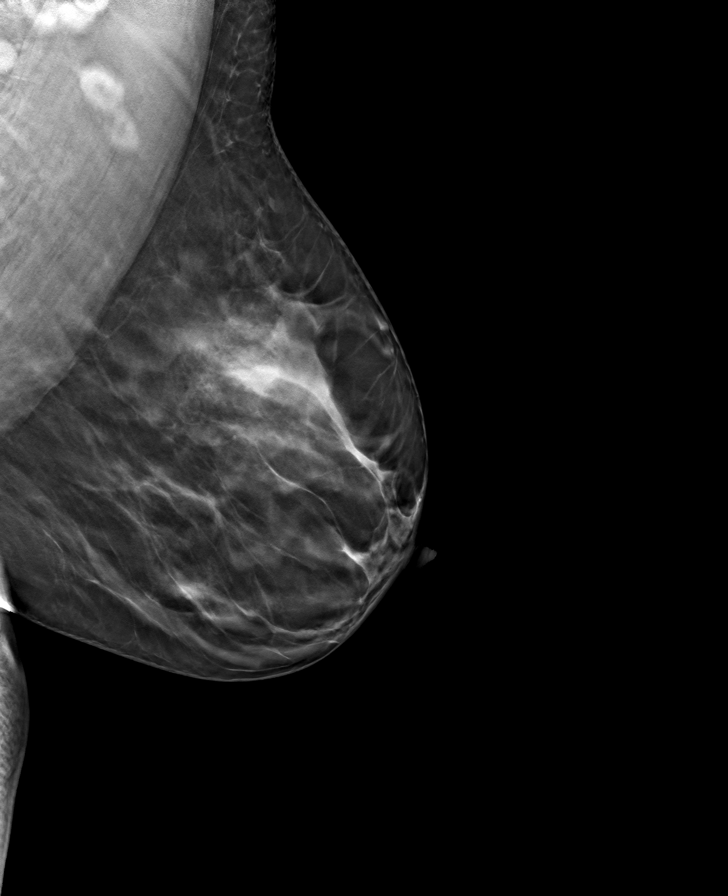

[R CC tomo · tomo slice 36/71.0]
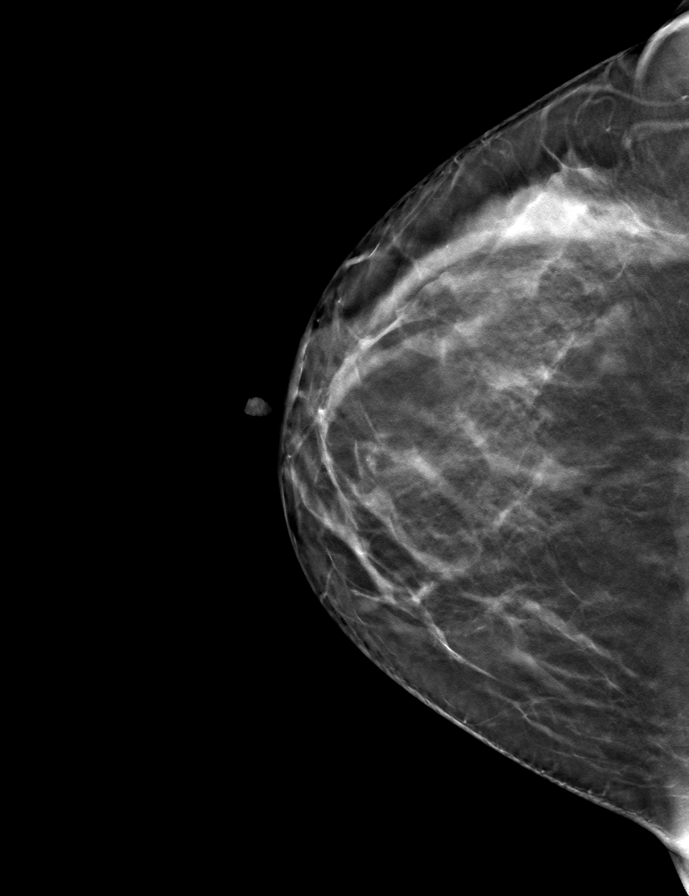

[8 of 24 positions shown; findings below may reference images not displayed]

ACR Breast Density Category b: There are scattered areas of
fibroglandular density.
FINDINGS: In the right breast, calcifications warrant further evaluation with
magnified views. In the left breast, no findings suspicious for
malignancy.
IMPRESSION: Further evaluation is suggested for calcifications in the right
breast.

RECOMMENDATION:
Diagnostic mammogram of the right breast. (Code:[id number])

The patient will be contacted regarding the findings, and additional
imaging will be scheduled.

BI-RADS CATEGORY  0: Incomplete. Need additional imaging evaluation
and/or prior mammograms for comparison.

## 2023-03-03 ENCOUNTER — Ambulatory Visit: Payer: No Typology Code available for payment source | Admitting: Nurse Practitioner

## 2023-03-03 DIAGNOSIS — H6122 Impacted cerumen, left ear: Secondary | ICD-10-CM

## 2023-03-03 NOTE — Progress Notes (Signed)
Acute Office Visit  Subjective:     Patient ID: Vanessa Shannon, female    DOB: [birth year], 47 y.o.   MRN: [id number]  No chief complaint on file.   HPI Patient is in today for c/o "ear popping" of her left ear. Thinks she may have excess ear wax. Denies ear pain or hearing concerns. Does reports some itching/ flaking of ear canal due to hx of psoriasis.   Review of Systems  HENT:  Negative for ear discharge, ear pain and hearing loss.         Objective:    There were no vitals taken for this visit.   Physical Exam Constitutional:      General: She is not in acute distress. HENT:     Right Ear: Hearing normal. No swelling or tenderness. There is no impacted cerumen.     Left Ear: Hearing normal. No swelling or tenderness.     Ears:     Comments: Flaking in bilateral ear canals. Unable to visualize TM due to flaking and possible cerumen.  Neurological:     Mental Status: She is alert.     No results found for any visits on 03/03/23.      Assessment & Plan:   Problem List Items Addressed This Visit   None Visit Diagnoses     Impacted cerumen of right ear    -  Primary  Left ear cleared with ear lavage procedure today. No cerumen present but skin flakes were cleared and TM was visualized after procedure- WNL. Patient tolerated procedure well. No dizziness noted immediately after. Advised to be aware of dizziness and possible change in equilibrium in the next couple of days. Discussed slow positional changes. Patient verbalizes understanding.    RTC if symptoms return, or as needed       No orders of the defined types were placed in this encounter.  As needed.  Gloris Ham, NP

## 2023-05-08 ENCOUNTER — Other Ambulatory Visit: Payer: Self-pay | Admitting: Family Medicine

## 2023-05-08 DIAGNOSIS — I10 Essential (primary) hypertension: Secondary | ICD-10-CM

## 2023-05-08 NOTE — Telephone Encounter (Signed)
Needs to sch f/u appt within 3 mo

## 2023-05-09 NOTE — Telephone Encounter (Signed)
Attempted to call but no answer. Sent my chart

## 2023-11-22 ENCOUNTER — Other Ambulatory Visit: Payer: Self-pay | Admitting: Nurse Practitioner

## 2023-11-22 DIAGNOSIS — Z1231 Encounter for screening mammogram for malignant neoplasm of breast: Secondary | ICD-10-CM

## 2023-11-22 NOTE — Progress Notes (Signed)
 Patient is interested in obtaining screening mammogram via mobile mammogram bus at [facility]. No concerns at this time. Last mammogram:10/07/2022  Order placed.

## 2023-11-24 ENCOUNTER — Ambulatory Visit: Admitting: Nurse Practitioner

## 2023-11-24 ENCOUNTER — Encounter: Payer: Self-pay | Admitting: Nurse Practitioner

## 2023-11-24 VITALS — BP 148/98 | HR 80 | Temp 97.6°F

## 2023-11-24 DIAGNOSIS — Z789 Other specified health status: Secondary | ICD-10-CM

## 2023-11-24 DIAGNOSIS — E559 Vitamin D deficiency, unspecified: Secondary | ICD-10-CM

## 2023-11-24 NOTE — Progress Notes (Signed)
 [facility]  Subjective:  Patient ID: Vanessa Shannon, female    DOB: [birth year]  Age: 48 y.o. MRN: [id number]  CC: Wellness Exam   HPI Vanessa Shannon presents for wellness exam visit for insurance benefit.  Patient has a PCP: Dr.Kulik  PMH significant for: HTN on amlodipine   Last labs per PCP were completed: Feb 2024  Health Maintenance:  Colonoscopy: up to date, completed June 2023, repeat in 10 years Mammogram: May 2024, planning to obtain mammogram via mobile mammogram bus 12/07/23 Pap: March 2024, negative     Smoker: never  Immunizations:  Shingrix-  due in 2 years.  Flu- yearly Tdap: 2017   Lifestyle: Diet- endorses increased water intake.  Exercise- walks 30 minutes a day 3-4 days a week.    Of note, she admits that she did not take BP med today.    Past Medical History:  Diagnosis Date   Allergy    seasonal   Anemia 2002   postop hemorrhage   Blood transfusion without reported diagnosis    uterine laceration with first CS post cesarean section   GERD (gastroesophageal reflux disease)    Headache    Heartburn during pregnancy    History of 3 cesarean sections 03/23/2016   P1 in 2002: pushed 3 hours before C/S, had cervical tear and PPH P2 in 2016: scheduled repeat LTCS   Hypertension     Past Surgical History:  Procedure Laterality Date   BREAST BIOPSY Right 03/09/2022   FIBROCYSTIC CHANGES WITH USUAL DUCTAL HYPERPLASIA (UDH) AND SCLEROSING ADENOSIS,   CESAREAN SECTION  2002   CESAREAN SECTION N/A 11/19/2014   Procedure: CESAREAN SECTION;  Surgeon: Krystal Deaner, MD;  Location: [facility];  Service: Obstetrics;  Laterality: N/A;   CESAREAN SECTION WITH BILATERAL TUBAL LIGATION Bilateral 04/24/2016   Procedure: CESAREAN SECTION WITH BILATERAL TUBAL LIGATION;  Surgeon: Elveria Mungo, MD;  Location: [facility];  Service: Obstetrics;  Laterality: Bilateral;   LAPAROSCOPY     emergency laproscopic surgery in 2002     Outpatient Medications Prior to Visit  Medication Sig Dispense Refill   amLODipine  (NORVASC ) 5 MG tablet TAKE 1 TABLET (5 MG TOTAL) BY MOUTH DAILY. 90 tablet 0   Cholecalciferol (VITAMIN D3) 250 MCG (10000 UT) capsule Take 10,000 Units by mouth daily.     diphenhydrAMINE  (SOMINEX) 25 MG tablet Take 25 mg by mouth as needed for sleep.     acetaminophen  (TYLENOL ) 500 MG tablet Take 500 mg by mouth every 6 (six) hours as needed.     Fluocinolone  Acetonide Scalp 0.01 % OIL Apply topically to the affected scalp area(s) once daily at night. (Patient not taking: Reported on 11/24/2023) 118.28 mL 2   Vitamin D , Ergocalciferol , (DRISDOL ) 1.25 MG (50000 UNIT) CAPS capsule Take 1 capsule (50,000 Units total) by mouth every 7 (seven) days. (Patient not taking: Reported on 11/24/2023) 8 capsule 0   No facility-administered medications prior to visit.    ROS Review of Systems  HENT:  Negative for hearing loss.   Eyes:  Negative for visual disturbance.  Respiratory:  Negative for shortness of breath.   Cardiovascular:  Negative for chest pain.  Gastrointestinal:  Negative for constipation, diarrhea, nausea and vomiting.  Musculoskeletal: Negative.   Neurological:  Positive for headaches (sinus). Negative for dizziness.    Objective:  BP (!) 148/98 (BP Location: Left Arm, Patient Position: Sitting, Cuff Size: Normal)   Pulse 80   Temp 97.6 F (36.4 C) (Temporal)  SpO2 99%   Physical Exam Constitutional:      General: She is not in acute distress. HENT:     Head: Normocephalic.     Right Ear: Tympanic membrane, ear canal and external ear normal.     Left Ear: Tympanic membrane, ear canal and external ear normal.     Ears:     Comments: Flakes noted in bilateral ear canals    Mouth/Throat:     Mouth: Mucous membranes are moist.     Pharynx: No oropharyngeal exudate or posterior oropharyngeal erythema.  Eyes:     Pupils: Pupils are equal, round, and reactive to light.  Cardiovascular:      Rate and Rhythm: Normal rate and regular rhythm.     Heart sounds: Normal heart sounds.  Pulmonary:     Effort: Pulmonary effort is normal.     Breath sounds: Normal breath sounds.  Abdominal:     General: Bowel sounds are normal.     Palpations: Abdomen is soft.  Musculoskeletal:        General: Normal range of motion.  Skin:    General: Skin is warm.  Neurological:     General: No focal deficit present.     Mental Status: She is alert and oriented to person, place, and time.  Psychiatric:        Mood and Affect: Mood normal.        Behavior: Behavior normal.      Assessment & Plan:    Vanessa Shannon was seen today for wellness exam.  Diagnoses and all orders for this visit:  Participant in health and wellness plan Adult wellness physical was conducted today. Importance of diet and exercise were discussed in detail.  We reviewed immunizations and gave recommendations regarding current immunization needed for age.  Preventative health exams needed: mammogram which is scheduled for later this month. Patient will return for fasting labs next week.   Patient was advised yearly wellness exam and follow-up with PCP as recommended.   Vitamin D  deficiency Plan to check labs Tuesday.   No orders of the defined types were placed in this encounter.   No orders of the defined types were placed in this encounter.   Follow-up:Tuesday for fasting labs

## 2023-11-29 ENCOUNTER — Other Ambulatory Visit: Admitting: Nurse Practitioner

## 2023-11-29 DIAGNOSIS — Z789 Other specified health status: Secondary | ICD-10-CM

## 2023-11-29 LAB — COMPLETE METABOLIC PANEL WITHOUT GFR
AG Ratio: 1.6 (calc) (ref 1.0–2.5)
ALT: 14 U/L (ref 6–29)
AST: 16 U/L (ref 10–35)
Albumin: 4.3 g/dL (ref 3.6–5.1)
Alkaline phosphatase (APISO): 66 U/L (ref 31–125)
BUN: 15 mg/dL (ref 7–25)
CO2: 29 mmol/L (ref 20–32)
Calcium: 8.5 mg/dL — ABNORMAL LOW (ref 8.6–10.2)
Chloride: 104 mmol/L (ref 98–110)
Creat: 0.68 mg/dL (ref 0.50–0.99)
Globulin: 2.7 g/dL (ref 1.9–3.7)
Glucose, Bld: 100 mg/dL — ABNORMAL HIGH (ref 65–99)
Potassium: 4.1 mmol/L (ref 3.5–5.3)
Sodium: 139 mmol/L (ref 135–146)
Total Bilirubin: 0.3 mg/dL (ref 0.2–1.2)
Total Protein: 7 g/dL (ref 6.1–8.1)

## 2023-11-29 LAB — LIPID PANEL
Cholesterol: 202 mg/dL — ABNORMAL HIGH (ref <200)
HDL: 70 mg/dL (ref >=50)
LDL Cholesterol (Calc): 117 mg/dL — ABNORMAL HIGH
Non-HDL Cholesterol (Calc): 132 mg/dL — ABNORMAL HIGH (ref <130)
Total CHOL/HDL Ratio: 2.9 (calc) (ref <5.0)
Triglycerides: 56 mg/dL (ref <150)

## 2023-11-29 LAB — CBC
HCT: 38.8 % (ref 35.0–45.0)
Hemoglobin: 12.1 g/dL (ref 11.7–15.5)
MCH: 30.2 pg (ref 27.0–33.0)
MCHC: 31.2 g/dL — ABNORMAL LOW (ref 32.0–36.0)
MCV: 96.8 fL (ref 80.0–100.0)
MPV: 11.4 fL (ref 7.5–12.5)
Platelets: 200 Thousand/uL (ref 140–400)
RBC: 4.01 Million/uL (ref 3.80–5.10)
RDW: 11.2 % (ref 11.0–15.0)
WBC: 4 Thousand/uL (ref 3.8–10.8)

## 2023-11-29 LAB — VITAMIN D 25 HYDROXY (VIT D DEFICIENCY, FRACTURES): Vit D, 25-Hydroxy: 35 ng/mL (ref 30–100)

## 2023-11-29 LAB — VITAMIN B12: Vitamin B-12: 282 pg/mL (ref 200–1100)

## 2023-12-06 ENCOUNTER — Ambulatory Visit: Payer: Self-pay | Admitting: Nurse Practitioner

## 2023-12-07 ENCOUNTER — Ambulatory Visit
Admission: RE | Admit: 2023-12-07 | Discharge: 2023-12-07 | Disposition: A | Discharge status: Home / Self Care | Source: Ambulatory Visit | Attending: Nurse Practitioner | Admitting: Nurse Practitioner

## 2023-12-07 DIAGNOSIS — Z1231 Encounter for screening mammogram for malignant neoplasm of breast: Secondary | ICD-10-CM
